# Patient Record
Sex: Female | Born: 2000 | Race: Black or African American | Hispanic: No | Marital: Single | Smoking: Current every day smoker
Health system: Southern US, Community
[De-identification: ages and names within clinical notes are randomized; demographics above are authoritative.]

## PROBLEM LIST (undated history)

## (undated) DIAGNOSIS — N730 Acute parametritis and pelvic cellulitis: Secondary | ICD-10-CM

## (undated) DIAGNOSIS — N83209 Unspecified ovarian cyst, unspecified side: Secondary | ICD-10-CM

## (undated) DIAGNOSIS — N939 Abnormal uterine and vaginal bleeding, unspecified: Secondary | ICD-10-CM

## (undated) DIAGNOSIS — F329 Major depressive disorder, single episode, unspecified: Secondary | ICD-10-CM

## (undated) DIAGNOSIS — Z8742 Personal history of other diseases of the female genital tract: Secondary | ICD-10-CM

## (undated) DIAGNOSIS — F419 Anxiety disorder, unspecified: Secondary | ICD-10-CM

## (undated) DIAGNOSIS — F32A Depression, unspecified: Secondary | ICD-10-CM

## (undated) DIAGNOSIS — Z8619 Personal history of other infectious and parasitic diseases: Secondary | ICD-10-CM

## (undated) DIAGNOSIS — D649 Anemia, unspecified: Secondary | ICD-10-CM

## (undated) DIAGNOSIS — G43009 Migraine without aura, not intractable, without status migrainosus: Secondary | ICD-10-CM

## (undated) DIAGNOSIS — A64 Unspecified sexually transmitted disease: Secondary | ICD-10-CM

## (undated) DIAGNOSIS — N946 Dysmenorrhea, unspecified: Secondary | ICD-10-CM

## (undated) HISTORY — DX: Depression, unspecified: F32.A

## (undated) HISTORY — DX: Migraine without aura, not intractable, without status migrainosus: G43.009

## (undated) HISTORY — DX: Unspecified sexually transmitted disease: A64

## (undated) HISTORY — DX: Anemia, unspecified: D64.9

## (undated) HISTORY — DX: Personal history of other infectious and parasitic diseases: Z86.19

## (undated) HISTORY — DX: Personal history of other diseases of the female genital tract: Z87.42

## (undated) HISTORY — DX: Anxiety disorder, unspecified: F41.9

## (undated) HISTORY — PX: NO PAST SURGERIES: SHX2092

## (undated) HISTORY — DX: Dysmenorrhea, unspecified: N94.6

## (undated) HISTORY — DX: Major depressive disorder, single episode, unspecified: F32.9

---
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
## 2018-07-27 DIAGNOSIS — W260XXA Contact with knife, initial encounter: Secondary | ICD-10-CM | POA: Insufficient documentation

## 2018-07-27 DIAGNOSIS — Y939 Activity, unspecified: Secondary | ICD-10-CM | POA: Diagnosis not present

## 2018-07-27 DIAGNOSIS — Y929 Unspecified place or not applicable: Secondary | ICD-10-CM | POA: Insufficient documentation

## 2018-07-27 DIAGNOSIS — S61219A Laceration without foreign body of unspecified finger without damage to nail, initial encounter: Secondary | ICD-10-CM | POA: Insufficient documentation

## 2018-07-27 DIAGNOSIS — Z5321 Procedure and treatment not carried out due to patient leaving prior to being seen by health care provider: Secondary | ICD-10-CM | POA: Insufficient documentation

## 2018-07-27 DIAGNOSIS — Y999 Unspecified external cause status: Secondary | ICD-10-CM | POA: Diagnosis not present

## 2018-07-28 ENCOUNTER — Ambulatory Visit (HOSPITAL_COMMUNITY)
Admission: EM | Admit: 2018-07-28 | Discharge: 2018-07-28 | Disposition: A | Discharge status: Home / Self Care | Payer: 59 | Attending: Family Medicine | Admitting: Family Medicine

## 2018-07-28 ENCOUNTER — Encounter (HOSPITAL_COMMUNITY): Payer: Self-pay

## 2018-07-28 ENCOUNTER — Emergency Department (HOSPITAL_COMMUNITY)
Admission: EM | Admit: 2018-07-28 | Discharge: 2018-07-28 | Disposition: A | Discharge status: Home / Self Care | Payer: 59 | Attending: Emergency Medicine | Admitting: Emergency Medicine

## 2018-07-28 ENCOUNTER — Other Ambulatory Visit: Payer: Self-pay

## 2018-07-28 DIAGNOSIS — S61215A Laceration without foreign body of left ring finger without damage to nail, initial encounter: Secondary | ICD-10-CM

## 2018-07-28 DIAGNOSIS — S61207A Unspecified open wound of left little finger without damage to nail, initial encounter: Secondary | ICD-10-CM | POA: Diagnosis not present

## 2018-07-28 DIAGNOSIS — W268XXA Contact with other sharp object(s), not elsewhere classified, initial encounter: Secondary | ICD-10-CM

## 2018-07-28 DIAGNOSIS — S61217A Laceration without foreign body of left little finger without damage to nail, initial encounter: Secondary | ICD-10-CM

## 2018-07-28 MED ORDER — IBUPROFEN 800 MG PO TABS: 800.0000 mg | ORAL_TABLET | Freq: Three times a day (TID) | ORAL | 0 refills | Status: DC

## 2018-07-28 NOTE — ED Provider Notes (Signed)
  [facility]   [id number] 07/28/18 Arrival Time: 1151  ASSESSMENT & PLAN:  1. Laceration of left ring finger without foreign body without damage to nail, initial encounter   2. Laceration of left little finger without foreign body without damage to nail, initial encounter    Finger wounds flushed under running water. Steri-strips applied without complication. May f/u as needed. Work note given.  Reviewed expectations re: course of current medical issues. Questions answered. Outlined signs and symptoms indicating need for more acute intervention. Patient verbalized understanding. After Visit Summary given.   SUBJECTIVE:  Nida BoatmanMahagony Muela is a 17 y.o. female who presents with lacerations to her L 4/5th fingers. Last evening. Seen in ED. Bandages applied. Here for evaluation. Minimal bleeding. Soreness present. No sensation changes.  Td UTD: Yes.  ROS: As per HPI.   OBJECTIVE:  Vitals:   07/28/18 1248 07/28/18 1251  BP: 108/69   Pulse: 77   Resp: 17   Temp: 99.2 F (37.3 C)   TempSrc: Oral   SpO2: 98% 98%     General appearance: alert; no distress Skin: lacerations of her L 4/5th distal fingers; linear; approx 1cm; minimal bleeding; fingers with FROM and normal capillary refill Psychological: alert and cooperative; normal mood and affect  Results for orders placed or performed in visit on 03/24/14  POCT urine pregnancy  Result Value Ref Range   Preg Test, Ur Negative     Labs Reviewed - No data to display  No results found.  Allergies  Allergen Reactions  . Bactrim [Sulfamethoxazole-Trimethoprim] Anaphylaxis    Chest is tight    History reviewed. No pertinent past medical history. Social History   Socioeconomic History  . Marital status: Single    Spouse name: Not on file  . Number of children: Not on file  . Years of education: Not on file  . Highest education level: Not on file  Occupational History  . Not on file  Social Needs  .  Financial resource strain: Not on file  . Food insecurity:    Worry: Not on file    Inability: Not on file  . Transportation needs:    Medical: Not on file    Non-medical: Not on file  Tobacco Use  . Smoking status: Never Smoker  . Smokeless tobacco: Never Used  Substance and Sexual Activity  . Alcohol use: No  . Drug use: No  . Sexual activity: Never  Lifestyle  . Physical activity:    Days per week: Not on file    Minutes per session: Not on file  . Stress: Not on file  Relationships  . Social connections:    Talks on phone: Not on file    Gets together: Not on file    Attends religious service: Not on file    Active member of club or organization: Not on file    Attends meetings of clubs or organizations: Not on file    Relationship status: Not on file  Other Topics Concern  . Not on file  Social History Narrative  . Not on file         Mardella LaymanHagler, Brendt Dible, MD 07/28/18 1352

## 2018-07-28 NOTE — ED Notes (Signed)
Around 0130 I was called out to the waiting area to speak with the patient. Pt asking for medical supplies to take her with her tonight because she is leaving. Pt has two fingers that have coban on them still intact. Mother reports they are just going to leave "because its too late to sew her up anyway".

## 2018-07-28 NOTE — ED Triage Notes (Addendum)
[facility] hand ring finger and pinky finger since last night. Pt states she cut finger while opening a box of spoons

## 2018-07-28 NOTE — ED Triage Notes (Signed)
Pt reports that she cut her fingers (middle, index, and thumb) while opening a box of spoons. Bleeding controlled. Able to move digits.

## 2018-09-13 ENCOUNTER — Encounter: Payer: Self-pay | Admitting: Obstetrics and Gynecology

## 2018-09-13 ENCOUNTER — Telehealth: Payer: Self-pay | Admitting: Obstetrics and Gynecology

## 2018-09-13 ENCOUNTER — Other Ambulatory Visit: Payer: Self-pay

## 2018-09-13 ENCOUNTER — Ambulatory Visit (INDEPENDENT_AMBULATORY_CARE_PROVIDER_SITE_OTHER): Payer: 59 | Admitting: Obstetrics and Gynecology

## 2018-09-13 ENCOUNTER — Telehealth: Payer: Self-pay

## 2018-09-13 VITALS — BP 88/52 | HR 74 | Resp 12 | Ht 63.0 in | Wt 95.4 lb

## 2018-09-13 DIAGNOSIS — N920 Excessive and frequent menstruation with regular cycle: Secondary | ICD-10-CM | POA: Diagnosis not present

## 2018-09-13 DIAGNOSIS — Z862 Personal history of diseases of the blood and blood-forming organs and certain disorders involving the immune mechanism: Secondary | ICD-10-CM

## 2018-09-13 DIAGNOSIS — Z01419 Encounter for gynecological examination (general) (routine) without abnormal findings: Secondary | ICD-10-CM

## 2018-09-13 DIAGNOSIS — Z113 Encounter for screening for infections with a predominantly sexual mode of transmission: Secondary | ICD-10-CM

## 2018-09-13 DIAGNOSIS — Z308 Encounter for other contraceptive management: Secondary | ICD-10-CM

## 2018-09-13 DIAGNOSIS — N946 Dysmenorrhea, unspecified: Secondary | ICD-10-CM

## 2018-09-13 DIAGNOSIS — N6322 Unspecified lump in the left breast, upper inner quadrant: Secondary | ICD-10-CM

## 2018-09-13 DIAGNOSIS — N6312 Unspecified lump in the right breast, upper inner quadrant: Secondary | ICD-10-CM

## 2018-09-13 DIAGNOSIS — N898 Other specified noninflammatory disorders of vagina: Secondary | ICD-10-CM

## 2018-09-13 DIAGNOSIS — Z0000 Encounter for general adult medical examination without abnormal findings: Secondary | ICD-10-CM

## 2018-09-13 MED ORDER — IBUPROFEN 800 MG PO TABS: 800.0000 mg | ORAL_TABLET | Freq: Three times a day (TID) | ORAL | 2 refills | Status: DC | PRN

## 2018-09-13 MED ORDER — NAPROXEN SODIUM 550 MG PO TABS: ORAL_TABLET | ORAL | 2 refills | Status: DC

## 2018-09-13 NOTE — Telephone Encounter (Signed)
Spoke with patient. Patient states naproxen Rx not covered by insurance, patient is unsure of her out of pocket cost, did not ask pharmacy.   Reviewed options:  1. Good Rx coupon: approximately $12.94 at [facility], [facility] 2. Transfer Rx to another pharmacy to reduce cost 3. OTC naproxen sodium   Advised to return call to office if any additional assistance is needed.   Routing to provider for final review. Patient is agreeable to disposition. Will close encounter.

## 2018-09-13 NOTE — Telephone Encounter (Signed)
Spoke with patient, advised as seen below per Dr. Oscar LaJertson. Patient request Rx for motrin 800 to [facility]. Rx d/c for anaprox. Patient verbalizes understanding.  Call placed to [facility]. Spoke with "Unice CobbleYkyta",RX cancelled for anaprox.

## 2018-09-13 NOTE — Telephone Encounter (Signed)
Patient stated that she went to go pick up prescription for Anaprox DS and it is not covered by insurance. °

## 2018-09-13 NOTE — Patient Instructions (Addendum)
EXERCISE AND DIET:  We recommended that you start or continue a regular exercise program for good health. Regular exercise means any activity that makes your heart beat faster and makes you sweat.  We recommend exercising at least 30 minutes per day at least 3 days a week, preferably 4 or 5.  We also recommend a diet low in fat and sugar.  Inactivity, poor dietary choices and obesity can cause diabetes, heart attack, stroke, and kidney damage, among others.    ALCOHOL AND SMOKING:  Women should limit their alcohol intake to no more than 7 drinks/beers/glasses of wine (combined, not each!) per week. Moderation of alcohol intake to this level decreases your risk of breast cancer and liver damage. And of course, no recreational drugs are part of a healthy lifestyle.  And absolutely no smoking or even second hand smoke. Most people know smoking can cause heart and lung diseases, but did you know it also contributes to weakening of your bones? Aging of your skin?  Yellowing of your teeth and nails?  CALCIUM AND VITAMIN D:  Adequate intake of calcium and Vitamin D are recommended.  The recommendations for exact amounts of these supplements seem to change often, but generally speaking 600 mg of calcium (either carbonate or citrate) and 800 units of Vitamin D per day seems prudent. Certain women may benefit from higher intake of Vitamin D.  If you are among these women, your doctor will have told you during your visit.    PAP SMEARS:  Pap smears, to check for cervical cancer or precancers,  have traditionally been done yearly, although recent scientific advances have shown that most women can have pap smears less often.  However, every woman still should have a physical exam from her gynecologist every year. It will include a breast check, inspection of the vulva and vagina to check for abnormal growths or skin changes, a visual exam of the cervix, and then an exam to evaluate the size and shape of the uterus and  ovaries.  And after 19 years of age, a rectal exam is indicated to check for rectal cancers. We will also provide age appropriate advice regarding health maintenance, like when you should have certain vaccines, screening for sexually transmitted diseases, bone density testing, colonoscopy, mammograms, etc.   MAMMOGRAMS:  All women over 40 years old should have a yearly mammogram. Many facilities now offer a "3D" mammogram, which may cost around $50 extra out of pocket. If possible,  we recommend you accept the option to have the 3D mammogram performed.  It both reduces the number of women who will be called back for extra views which then turn out to be normal, and it is better than the routine mammogram at detecting truly abnormal areas.    COLONOSCOPY:  Colonoscopy to screen for colon cancer is recommended for all women at age 50.  We know, you hate the idea of the prep.  We agree, BUT, having colon cancer and not knowing it is worse!!  Colon cancer so often starts as a polyp that can be seen and removed at colonscopy, which can quite literally save your life!  And if your first colonoscopy is normal and you have no family history of colon cancer, most women don't have to have it again for 10 years.  Once every ten years, you can do something that may end up saving your life, right?  We will be happy to help you get it scheduled when you are ready.    Be sure to check your insurance coverage so you understand how much it will cost.  It may be covered as a preventative service at no cost, but you should check your particular policy.     Medroxyprogesterone injection [Contraceptive] What is this medicine? MEDROXYPROGESTERONE (me DROX ee proe JES te rone) contraceptive injections prevent pregnancy. They provide effective birth control for 3 months. Depo-subQ Provera 104 is also used for treating pain related to endometriosis. This medicine may be used for other purposes; ask your health care provider or  pharmacist if you have questions. COMMON BRAND NAME(S): Depo-Provera, Depo-subQ Provera 104 What should I tell my health care provider before I take this medicine? They need to know if you have any of these conditions: -frequently drink alcohol -asthma -blood vessel disease or a history of a blood clot in the lungs or legs -bone disease such as osteoporosis -breast cancer -diabetes -eating disorder (anorexia nervosa or bulimia) -high blood pressure -HIV infection or AIDS -kidney disease -liver disease -mental depression -migraine -seizures (convulsions) -stroke -tobacco smoker -vaginal bleeding -an unusual or allergic reaction to medroxyprogesterone, other hormones, medicines, foods, dyes, or preservatives -pregnant or trying to get pregnant -breast-feeding How should I use this medicine? Depo-Provera Contraceptive injection is given into a muscle. Depo-subQ Provera 104 injection is given under the skin. These injections are given by a health care professional. You must not be pregnant before getting an injection. The injection is usually given during the first 5 days after the start of a menstrual period or 6 weeks after delivery of a baby. Talk to your pediatrician regarding the use of this medicine in children. Special care may be needed. These injections have been used in female children who have started having menstrual periods. Overdosage: If you think you have taken too much of this medicine contact a poison control center or emergency room at once. NOTE: This medicine is only for you. Do not share this medicine with others. What if I miss a dose? Try not to miss a dose. You must get an injection once every 3 months to maintain birth control. If you cannot keep an appointment, call and reschedule it. If you wait longer than 13 weeks between Depo-Provera contraceptive injections or longer than 14 weeks between Depo-subQ Provera 104 injections, you could get pregnant. Use another  method for birth control if you miss your appointment. You may also need a pregnancy test before receiving another injection. What may interact with this medicine? Do not take this medicine with any of the following medications: -bosentan This medicine may also interact with the following medications: -aminoglutethimide -antibiotics or medicines for infections, especially rifampin, rifabutin, rifapentine, and griseofulvin -aprepitant -barbiturate medicines such as phenobarbital or primidone -bexarotene -carbamazepine -medicines for seizures like ethotoin, felbamate, oxcarbazepine, phenytoin, topiramate -modafinil -St. John's wort This list may not describe all possible interactions. Give your health care provider a list of all the medicines, herbs, non-prescription drugs, or dietary supplements you use. Also tell them if you smoke, drink alcohol, or use illegal drugs. Some items may interact with your medicine. What should I watch for while using this medicine? This drug does not protect you against HIV infection (AIDS) or other sexually transmitted diseases. Use of this product may cause you to lose calcium from your bones. Loss of calcium may cause weak bones (osteoporosis). Only use this product for more than 2 years if other forms of birth control are not right for you. The longer you use this product for birth  control the more likely you will be at risk for weak bones. Ask your health care professional how you can keep strong bones. You may have a change in bleeding pattern or irregular periods. Many females stop having periods while taking this drug. If you have received your injections on time, your chance of being pregnant is very low. If you think you may be pregnant, see your health care professional as soon as possible. Tell your health care professional if you want to get pregnant within the next year. The effect of this medicine may last a long time after you get your last  injection. What side effects may I notice from receiving this medicine? Side effects that you should report to your doctor or health care professional as soon as possible: -allergic reactions like skin rash, itching or hives, swelling of the face, lips, or tongue -breast tenderness or discharge -breathing problems -changes in vision -depression -feeling faint or lightheaded, falls -fever -pain in the abdomen, chest, groin, or leg -problems with balance, talking, walking -unusually weak or tired -yellowing of the eyes or skin Side effects that usually do not require medical attention (report to your doctor or health care professional if they continue or are bothersome): -acne -fluid retention and swelling -headache -irregular periods, spotting, or absent periods -temporary pain, itching, or skin reaction at site where injected -weight gain This list may not describe all possible side effects. Call your doctor for medical advice about side effects. You may report side effects to FDA at 1-800-FDA-1088. Where should I keep my medicine? This does not apply. The injection will be given to you by a health care professional. NOTE: This sheet is a summary. It may not cover all possible information. If you have questions about this medicine, talk to your doctor, pharmacist, or health care provider.  2018 Elsevier/Gold Standard (2009-01-02 18:37:56)  Breast Self-Awareness Breast self-awareness means being familiar with how your breasts look and feel. It involves checking your breasts regularly and reporting any changes to your health care provider. Practicing breast self-awareness is important. A change in your breasts can be a sign of a serious medical problem. Being familiar with how your breasts look and feel allows you to find any problems early, when treatment is more likely to be successful. All women should practice breast self-awareness, including women who have had breast implants. How  to do a breast self-exam One way to learn what is normal for your breasts and whether your breasts are changing is to do a breast self-exam. To do a breast self-exam: Look for Changes  1. Remove all the clothing above your waist. 2. Stand in front of a mirror in a room with good lighting. 3. Put your hands on your hips. 4. Push your hands firmly downward. 5. Compare your breasts in the mirror. Look for differences between them (asymmetry), such as: ? Differences in shape. ? Differences in size. ? Puckers, dips, and bumps in one breast and not the other. 6. Look at each breast for changes in your skin, such as: ? Redness. ? Scaly areas. 7. Look for changes in your nipples, such as: ? Discharge. ? Bleeding. ? Dimpling. ? Redness. ? A change in position. Feel for Changes  Carefully feel your breasts for lumps and changes. It is best to do this while lying on your back on the floor and again while sitting or standing in the shower or tub with soapy water on your skin. Feel each breast in the following  way:  Place the arm on the side of the breast you are examining above your head.  Feel your breast with the other hand.  Start in the nipple area and make  inch (2 cm) overlapping circles to feel your breast. Use the pads of your three middle fingers to do this. Apply light pressure, then medium pressure, then firm pressure. The light pressure will allow you to feel the tissue closest to the skin. The medium pressure will allow you to feel the tissue that is a little deeper. The firm pressure will allow you to feel the tissue close to the ribs.  Continue the overlapping circles, moving downward over the breast until you feel your ribs below your breast.  Move one finger-width toward the center of the body. Continue to use the  inch (2 cm) overlapping circles to feel your breast as you move slowly up toward your collarbone.  Continue the up and down exam using all three pressures until  you reach your armpit.  Write Down What You Find  Write down what is normal for each breast and any changes that you find. Keep a written record with breast changes or normal findings for each breast. By writing this information down, you do not need to depend only on memory for size, tenderness, or location. Write down where you are in your menstrual cycle, if you are still menstruating. If you are having trouble noticing differences in your breasts, do not get discouraged. With time you will become more familiar with the variations in your breasts and more comfortable with the exam. How often should I examine my breasts? Examine your breasts every month. If you are breastfeeding, the best time to examine your breasts is after a feeding or after using a breast pump. If you menstruate, the best time to examine your breasts is 5-7 days after your period is over. During your period, your breasts are lumpier, and it may be more difficult to notice changes. When should I see my health care provider? See your health care provider if you notice:  A change in shape or size of your breasts or nipples.  A change in the skin of your breast or nipples, such as a reddened or scaly area.  Unusual discharge from your nipples.  A lump or thick area that was not there before.  Pain in your breasts.  Anything that concerns you.  This information is not intended to replace advice given to you by your health care provider. Make sure you discuss any questions you have with your health care provider. Document Released: 12/12/2005 Document Revised: 05/19/2016 Document Reviewed: 11/01/2015 Elsevier Interactive Patient Education  [employer].

## 2018-09-13 NOTE — Telephone Encounter (Signed)
Please offer to call in ibuprofen (800 mg po q 8 hours prn) as an alternative. If she desires this call in 30 tabs with 2 refills.

## 2018-09-13 NOTE — Addendum Note (Signed)
Addended by: Leda MinHAMM, Delainey Winstanley N on: 09/13/2018 11:49 AM   Modules accepted: Orders

## 2018-09-13 NOTE — Telephone Encounter (Signed)
Patient stated that she went to go pick up prescription for Anaprox DS and it is not covered by insurance.

## 2018-09-13 NOTE — Telephone Encounter (Signed)
Naproxen 550mg  is not covered by Medicaid. Naproxen 500 is preferred.

## 2018-09-13 NOTE — Progress Notes (Signed)
17 y.o. G0P0000 Single Black or African American Not Hispanic or Latino female here for annual exam.   Period Cycle (Days): 30 Period Duration (Days): 7-9 Period Pattern: Regular Menstrual Flow: Heavy, Moderate Menstrual Control: Tampon Menstrual Control Change Freq (Hours): states changes super plus every 2 hours Dysmenorrhea: (!) Severe Dysmenorrhea Symptoms: Cramping  Cramps are severe for 3 days, she can vomit from the pain. Ibuprofen may or may not help. Takes the edge off.  Sexually active, same partner x 3 years. Only sexually active with this partner since last year. +/- using condoms. No dyspareunia.  She notes a thick white d/c the week prior to her cycle.  She had a normal TSH a few months ago, was anemic at that time and started iron pills.   Patient's last menstrual period was 08/25/2018.          Sexually active: Yes.    The current method of family planning is condoms sometimes.    Exercising: No.  The patient does not participate in regular exercise at present. Smoker:  Vaping some days, cutting back.   Health Maintenance: Pap:  never History of abnormal Pap:  n/a TDaP:  UTD per patient Gardasil: completed x3 per patient   reports that she has never smoked. She has never used smokeless tobacco. She reports that she does not drink alcohol or use drugs. Occasional ETOH. 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   Past Medical History:  Diagnosis Date  . Anemia   . Anxiety   . Depression   . Dysmenorrhea     History reviewed. No pertinent surgical history.  Current Outpatient Medications  Medication Sig Dispense Refill  . ibuprofen (ADVIL,MOTRIN) 800 MG tablet Take 1 tablet (800 mg total) by mouth 3 (three) times daily. 21 tablet 0  . IRON PO Take by mouth.     No current facility-administered medications for this visit.     Family History  Problem Relation Age of Onset  . Endometriosis Mother   . Sarcoidosis Mother   . Heart disease Father      Review of Systems  Constitutional: Negative.   HENT: Negative.   Eyes: Negative.   Respiratory: Negative.   Cardiovascular: Negative.   Gastrointestinal: Negative.   Endocrine: Negative.   Genitourinary: Positive for menstrual problem and vaginal discharge.       Dysmenorrhea White discharge prior to cycle  Musculoskeletal: Negative.   Skin: Negative.   Allergic/Immunologic: Negative.   Neurological: Negative.   Hematological: Negative.   Psychiatric/Behavioral: Negative.     Exam:   BP (!) 88/52 (BP Location: Right Arm, Patient Position: Sitting, Cuff Size: Normal)   Pulse 74   Resp 12   Ht 5\' 3"  (1.6 m)   Wt 95 lb 6.4 oz (43.3 kg)   LMP 08/25/2018   BMI 16.90 kg/m   Weight change: @WEIGHTCHANGE @ Height:   Height: 5\' 3"  (160 cm)  Ht Readings from Last 3 Encounters:  09/13/18 5\' 3"  (1.6 m) (31 %, Z= -0.50)*  07/28/18 5\' 4"  (1.626 m) (46 %, Z= -0.11)*  01/20/16 5\' 1"  (1.549 m) (11 %, Z= -1.22)*   * Growth percentiles are based on CDC (Girls, 2-20 Years) data.    General appearance: alert, cooperative and appears stated age Head: Normocephalic, without obvious abnormality, atraumatic Neck: no adenopathy, supple, symmetrical, trachea midline and thyroid normal to inspection and palpation Lungs: clear to auscultation bilaterally Cardiovascular: regular rate and rhythm Breasts: in the right breast at 1 o'clock near the periphery  is a pea sized, firm, mobile, not tender lump. In the left breast at 10 o'clock a few cm from the areolar region is a firm, mobile, lima bean shaped lump. No skin changes.  Abdomen: soft, non-tender; non distended,  no masses,  no organomegaly Extremities: extremities normal, atraumatic, no cyanosis or edema Skin: Skin color, texture, turgor normal. No rashes or lesions Lymph nodes: Cervical, supraclavicular, and axillary nodes normal. No abnormal inguinal nodes palpated Neurologic: Grossly normal   Pelvic: External genitalia:  no lesions               Urethra:  normal appearing urethra with no masses, tenderness or lesions              Bartholins and Skenes: normal                 Vagina: normal appearing vagina with normal color. Slight increase in watery, white vaginal discharge              Cervix: no cervical motion tenderness and no lesions               Bimanual Exam:  Uterus:  normal size, contour, position, consistency, mobility, non-tender and retroverted              Adnexa: no mass, fullness, tenderness               Rectovaginal: deferred  Chaperone was present for exam.  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 Reviewed risks and side effects. Return for depo-provera on her cycle  Recommended condoms for STD protection  Anaprox for cramps  Bilateral breast ultrasounds

## 2018-09-13 NOTE — Progress Notes (Signed)
Patient scheduled for bilateral Breast ultrasounds at [facility] imaging on 09/28/2018 at 1010. Mother of patient with her and has questions about plan of care. Option to wait for further consult today with Dr. Oscar LaJertson or call back with first day of cycle for office visit with Dr. Oscar LaJertson to talk further about her concerns. Pt states she will call back on first day of cycle and schedule office visit to discuss plan of care.

## 2018-09-14 ENCOUNTER — Encounter: Payer: Self-pay | Admitting: Obstetrics and Gynecology

## 2018-09-14 ENCOUNTER — Telehealth: Payer: Self-pay | Admitting: Emergency Medicine

## 2018-09-14 LAB — COMPREHENSIVE METABOLIC PANEL
ALT: 8 IU/L (ref 0–32)
AST: 17 IU/L (ref 0–40)
Albumin/Globulin Ratio: 2 (ref 1.2–2.2)
Albumin: 4.9 g/dL (ref 3.5–5.5)
Alkaline Phosphatase: 66 IU/L (ref 39–117)
BUN/Creatinine Ratio: 22 (ref 9–23)
BUN: 18 mg/dL (ref 6–20)
Bilirubin Total: <0.2 mg/dL (ref 0.0–1.2)
CO2: 21 mmol/L (ref 20–29)
CREATININE: 0.82 mg/dL (ref 0.57–1.00)
Calcium: 9.9 mg/dL (ref 8.7–10.2)
Chloride: 103 mmol/L (ref 96–106)
GFR calc Af Amer: 120 mL/min/{1.73_m2} (ref >59)
GFR, EST NON AFRICAN AMERICAN: 104 mL/min/{1.73_m2} (ref >59)
GLUCOSE: 55 mg/dL — AB (ref 65–99)
Globulin, Total: 2.4 g/dL (ref 1.5–4.5)
POTASSIUM: 3.9 mmol/L (ref 3.5–5.2)
Sodium: 139 mmol/L (ref 134–144)
TOTAL PROTEIN: 7.3 g/dL (ref 6.0–8.5)

## 2018-09-14 LAB — CBC
HEMATOCRIT: 36.5 % (ref 34.0–46.6)
HEMOGLOBIN: 11.6 g/dL (ref 11.1–15.9)
MCH: 28 pg (ref 26.6–33.0)
MCHC: 31.8 g/dL (ref 31.5–35.7)
MCV: 88 fL (ref 79–97)
Platelets: 296 10*3/uL (ref 150–450)
RBC: 4.14 x10E6/uL (ref 3.77–5.28)
RDW: 13.7 % (ref 12.3–15.4)
WBC: 5.9 10*3/uL (ref 3.4–10.8)

## 2018-09-14 LAB — FERRITIN: Ferritin: 17 ng/mL (ref 15–77)

## 2018-09-14 LAB — VAGINITIS/VAGINOSIS, DNA PROBE
CANDIDA SPECIES: NEGATIVE
Gardnerella vaginalis: POSITIVE — AB
Trichomonas vaginosis: NEGATIVE

## 2018-09-14 LAB — LIPID PANEL
CHOL/HDL RATIO: 2.1 ratio (ref 0.0–4.4)
Cholesterol, Total: 155 mg/dL (ref 100–169)
HDL: 75 mg/dL (ref >39)
LDL CALC: 69 mg/dL (ref 0–109)
TRIGLYCERIDES: 53 mg/dL (ref 0–89)
VLDL Cholesterol Cal: 11 mg/dL (ref 5–40)

## 2018-09-14 LAB — HEP, RPR, HIV PANEL
HEP B S AG: NEGATIVE
HIV Screen 4th Generation wRfx: NONREACTIVE
RPR Ser Ql: NONREACTIVE

## 2018-09-14 LAB — HEPATITIS C ANTIBODY

## 2018-09-14 MED ORDER — NAPROXEN 500 MG PO TABS: 500.0000 mg | ORAL_TABLET | Freq: Two times a day (BID) | ORAL | 2 refills | Status: DC | PRN

## 2018-09-14 MED ORDER — METRONIDAZOLE 0.75 % VA GEL: 1.0000 | Freq: Every day | VAGINAL | 0 refills | Status: AC

## 2018-09-14 NOTE — Telephone Encounter (Signed)
Motrin 800 mg po sent in 09/13/18 by Triage Nurse Carmelina DaneJill Hamm. Pt picked up Rx yesterday.

## 2018-09-14 NOTE — Telephone Encounter (Signed)
Noted message from yesterday. Pt has picked up Motrin 800 mg po. Order for Naproxen 500 mg tablet DC and [facility] notified.

## 2018-09-14 NOTE — Telephone Encounter (Signed)
Spoke with patinet and message from Dr Oscar LaJertson given. Metrogel Rx sent and discussed use.  Verbal order received for Naproxen 500 mg po q 12 PRN Cramps from Dr. Edward JollySilva.  Will call back with further CG/CH results.  Encounter closed.

## 2018-09-14 NOTE — Telephone Encounter (Signed)
-----   Message from Romualdo BolkJill Evelyn Jertson, MD sent at 09/14/2018 10:25 AM EDT ----- Please inform the patient that her vaginitis probe was + for BV and treat with flagyl (either oral or vaginal, her choice), no ETOH while on Flagyl.  Oral: Flagyl 500 mg BID x 7 days, or Vaginal: Metrogel, 1 applicator per vagina q day x 5 days. Her hep C and cervical cultures are pending, everything else was normal.

## 2018-09-16 LAB — GC/CHLAMYDIA PROBE AMP
Chlamydia trachomatis, NAA: NEGATIVE
Neisseria gonorrhoeae by PCR: NEGATIVE

## 2018-09-17 ENCOUNTER — Telehealth: Payer: Self-pay

## 2018-09-17 NOTE — Telephone Encounter (Signed)
Informed patient of results

## 2018-09-17 NOTE — Telephone Encounter (Signed)
-----   Message from Romualdo BolkJill Evelyn Jertson, MD sent at 09/16/2018  1:55 PM EDT ----- Cervical cultures and Hep C are negative

## 2018-09-20 ENCOUNTER — Other Ambulatory Visit: Payer: 59

## 2018-09-28 ENCOUNTER — Ambulatory Visit
Admission: RE | Admit: 2018-09-28 | Discharge: 2018-09-28 | Disposition: A | Discharge status: Home / Self Care | Payer: 59 | Source: Ambulatory Visit | Attending: Obstetrics and Gynecology | Admitting: Obstetrics and Gynecology

## 2018-09-28 ENCOUNTER — Other Ambulatory Visit: Payer: Self-pay | Admitting: Obstetrics and Gynecology

## 2018-09-28 DIAGNOSIS — N6310 Unspecified lump in the right breast, unspecified quadrant: Secondary | ICD-10-CM

## 2018-09-28 DIAGNOSIS — N6320 Unspecified lump in the left breast, unspecified quadrant: Secondary | ICD-10-CM

## 2018-09-28 DIAGNOSIS — N6312 Unspecified lump in the right breast, upper inner quadrant: Secondary | ICD-10-CM

## 2018-09-28 DIAGNOSIS — N6322 Unspecified lump in the left breast, upper inner quadrant: Secondary | ICD-10-CM

## 2018-10-01 ENCOUNTER — Telehealth: Payer: Self-pay | Admitting: Obstetrics and Gynecology

## 2018-10-01 NOTE — Telephone Encounter (Signed)
Spoke with patient. Reports vaginal itching, burning and white, thick, clumpy vaginal d/c. Symptoms started 3 days ago. Denies urinary symptoms, odor or pain. Completed metrogel 9/24, LMP 9/25. Asking for recommendations.  Advised patient can try OTC monistat, if symptoms don not resolve OV recommended for further evaluation. Advised Dr. Oscar La will review, I will return call if any additional recommendations.  Patient verbalizes understanding and is agreeable.   Routing to provider for final review. Patient is agreeable to disposition. Will close encounter.

## 2018-10-01 NOTE — Telephone Encounter (Signed)
Patient is asking to talk with a nurse about her medication. No further details given.

## 2018-10-02 ENCOUNTER — Telehealth: Payer: Self-pay

## 2018-10-02 NOTE — Telephone Encounter (Signed)
Patient returning call to Kaitlyn. °

## 2018-10-02 NOTE — Telephone Encounter (Signed)
Left message to call Kaitlyn at [phone].  Patient removed from mammogram hold. 6 month recall placed.

## 2018-10-02 NOTE — Telephone Encounter (Signed)
-----   Message from Romualdo Bolk, MD sent at 09/28/2018  1:39 PM EDT ----- Take out of mammogram hold, place in recall for 6 months. Please also schedule her for a f/u breast check in 3 months. She should do monthly breast self exams and call with any changes or concerns

## 2018-10-02 NOTE — Telephone Encounter (Signed)
Spoke with patient. Patient states that she will need to speak with her mom regarding appointment scheduling and will return call to schedule appointment.

## 2018-10-09 NOTE — Telephone Encounter (Signed)
Left message to call Dael Howland at [phone]. 

## 2018-10-14 ENCOUNTER — Encounter

## 2018-10-23 NOTE — Telephone Encounter (Signed)
Spoke with patient. Appointment scheduled for 11/12/2018 at 2 pm. Patient is agreeable to date and time. Encounter closed.

## 2018-11-10 ENCOUNTER — Emergency Department (HOSPITAL_COMMUNITY)
Admission: EM | Admit: 2018-11-10 | Discharge: 2018-11-10 | Disposition: A | Discharge status: Home / Self Care | Payer: 59 | Attending: Emergency Medicine | Admitting: Emergency Medicine

## 2018-11-10 ENCOUNTER — Other Ambulatory Visit: Payer: Self-pay

## 2018-11-10 ENCOUNTER — Encounter (HOSPITAL_COMMUNITY): Payer: Self-pay

## 2018-11-10 DIAGNOSIS — R21 Rash and other nonspecific skin eruption: Secondary | ICD-10-CM | POA: Diagnosis present

## 2018-11-10 DIAGNOSIS — B354 Tinea corporis: Secondary | ICD-10-CM

## 2018-11-10 NOTE — Discharge Instructions (Addendum)
Apply Lotrimin cream to area as directed.  Continue to apply cream to the area after 2 weeks of resolution. Follow-up with dermatology if symptoms persist.  Return to ER for severe or concerning symptoms.

## 2018-11-10 NOTE — ED Triage Notes (Signed)
Pt reports rash that started 2 weeks ago on her abd and a spot on her chest, denies itchiness. Dry patch

## 2018-11-10 NOTE — ED Provider Notes (Signed)
[facility] Provider Note   CSN: [id number] Arrival date & time: 11/10/18  2028     History   Chief Complaint Chief Complaint  Patient presents with  . Rash    HPI Mercedes Guzman is a 17 y.o. female.  17 year old female presents with complaint of rash on her trunk onset 2 weeks ago.  Patient believes she may have ringworm, initial patch on her left lower chest now with a patch to her right upper chest and right lower chest areas.  Denies itching or any other complaints or concerns.     Past Medical History:  Diagnosis Date  . Anemia   . Anxiety   . Depression   . Dysmenorrhea   . Migraine without aura     Patient Active Problem List   Diagnosis Date Noted  . Dysmenorrhea 03/27/2014    History reviewed. No pertinent surgical history.   OB History    Gravida  0   Para  0   Term  0   Preterm  0   AB  0   Living  0     SAB  0   TAB  0   Ectopic  0   Multiple  0   Live Births               Home Medications    Prior to Admission medications   Medication Sig Start Date End Date Taking? Authorizing Provider  IRON PO Take by mouth.    [provider]    Family History Family History  Problem Relation Age of Onset  . Endometriosis Mother   . Sarcoidosis Mother   . Heart disease Father     Social History Social History   Tobacco Use  . Smoking status: Never Smoker  . Smokeless tobacco: Never Used  Substance Use Topics  . Alcohol use: No  . Drug use: No     Allergies   Bactrim [sulfamethoxazole-trimethoprim]   Review of Systems Review of Systems  Constitutional: Negative for fever.  Musculoskeletal: Negative for joint swelling and myalgias.  Skin: Positive for rash. Negative for wound.  Allergic/Immunologic: Negative for immunocompromised state.  Hematological: Negative for adenopathy. Does not bruise/bleed easily.  All other systems reviewed and are  negative.    Physical Exam Updated Vital Signs BP 114/80 (BP Location: Right Arm)   Pulse 73   Temp 98.8 F (37.1 C) (Oral)   Resp 14   SpO2 100%   Physical Exam  Constitutional: She is oriented to person, place, and time. She appears well-developed and well-nourished. No distress.  HENT:  Head: Normocephalic and atraumatic.  Pulmonary/Chest: Effort normal.  Neurological: She is alert and oriented to person, place, and time.  Skin: Skin is warm and dry. Rash noted. She is not diaphoretic.     Psychiatric: She has a normal mood and affect. Her behavior is normal.  Nursing note and vitals reviewed.    ED Treatments / Results  Labs (all labs ordered are listed, but only abnormal results are displayed) Labs Reviewed - No data to display  EKG None  Radiology No results found.  Procedures Procedures (including critical care time)  Medications Ordered in ED Medications - No data to display   Initial Impression / Assessment and Plan / ED Course  I have reviewed the triage vital signs and the nursing notes.  Pertinent labs & imaging results that were available during my care of the patient were reviewed by me  and considered in my medical decision making (see chart for details).  Clinical Course as of Nov 10 2122  Sat Nov 10, 2018  [id number] 17 year old female with rash consistent with ringworm.  Recommend OTC Lotrimin cream to area and continue with cream until rash resolves x2 weeks.   [LM]    Clinical Course User Index [LM] Jeannie Fend, PA-C   Final Clinical Impressions(s) / ED Diagnoses   Final diagnoses:  Tinea corporis    ED Discharge Orders    None       Alden Hipp 11/10/18 2123    Maia Plan, MD 11/11/18 [phone]

## 2018-11-12 ENCOUNTER — Encounter (HOSPITAL_COMMUNITY): Payer: Self-pay

## 2018-11-12 ENCOUNTER — Ambulatory Visit (HOSPITAL_COMMUNITY)
Admission: EM | Admit: 2018-11-12 | Discharge: 2018-11-12 | Disposition: A | Discharge status: Home / Self Care | Payer: 59 | Attending: Family Medicine | Admitting: Family Medicine

## 2018-11-12 ENCOUNTER — Ambulatory Visit: Payer: 59 | Admitting: Obstetrics and Gynecology

## 2018-11-12 DIAGNOSIS — B354 Tinea corporis: Secondary | ICD-10-CM

## 2018-11-12 MED ORDER — TERBINAFINE HCL 250 MG PO TABS: 250.0000 mg | ORAL_TABLET | Freq: Every day | ORAL | 0 refills | Status: DC

## 2018-11-12 NOTE — ED Provider Notes (Signed)
[facility]    CSN: [id number] Arrival date & time: 11/12/18  1858     History   Chief Complaint Chief Complaint  Patient presents with  . Rash    HPI Mercedes Guzman is a 17 y.o. female.  Patient was seen in the emergency room 2 days ago and discharged with diagnosis of tinea corporis.  Lamisil cream was suggested.  Mom accompanies patient today concerned that she is still seeing new lesions.   HPI  Past Medical History:  Diagnosis Date  . Anemia   . Anxiety   . Depression   . Dysmenorrhea   . Migraine without aura     Patient Active Problem List   Diagnosis Date Noted  . Dysmenorrhea 03/27/2014    History reviewed. No pertinent surgical history.  OB History    Gravida  0   Para  0   Term  0   Preterm  0   AB  0   Living  0     SAB  0   TAB  0   Ectopic  0   Multiple  0   Live Births               Home Medications    Prior to Admission medications   Medication Sig Start Date End Date Taking? Authorizing Provider  IRON PO Take by mouth.    [provider]    Family History Family History  Problem Relation Age of Onset  . Endometriosis Mother   . Sarcoidosis Mother   . Heart disease Father     Social History Social History   Tobacco Use  . Smoking status: Never Smoker  . Smokeless tobacco: Never Used  Substance Use Topics  . Alcohol use: No  . Drug use: No     Allergies   Bactrim [sulfamethoxazole-trimethoprim]   Review of Systems Review of Systems  Constitutional: Negative for chills and fever.  HENT: Negative for ear pain and sore throat.   Eyes: Negative for pain and visual disturbance.  Respiratory: Negative for cough and shortness of breath.   Cardiovascular: Negative for chest pain and palpitations.  Gastrointestinal: Negative for abdominal pain and vomiting.  Genitourinary: Negative for dysuria and hematuria.  Musculoskeletal: Negative for arthralgias and back pain.  Skin: Positive  for rash. Negative for color change.  Neurological: Negative for seizures and syncope.  All other systems reviewed and are negative.    Physical Exam Triage Vital Signs ED Triage Vitals  Enc Vitals Group     BP 11/12/18 1947 113/66     Pulse Rate 11/12/18 1947 88     Resp 11/12/18 1947 20     Temp 11/12/18 1947 98.2 F (36.8 C)     Temp Source 11/12/18 1947 Oral     SpO2 11/12/18 1947 100 %     Weight --      Height --      Head Circumference --      Peak Flow --      Pain Score 11/12/18 1951 0     Pain Loc --      Pain Edu? --      Excl. in GC? --    No data found.  Updated Vital Signs BP 113/66 (BP Location: Left Arm)   Pulse 88   Temp 98.2 F (36.8 C) (Oral)   Resp 20   LMP 10/16/2018   SpO2 100%   Visual Acuity Right Eye Distance:   Left Eye  Distance:   Bilateral Distance:    Right Eye Near:   Left Eye Near:    Bilateral Near:     Physical Exam  Constitutional: She appears well-developed and well-nourished.  Skin:  There are several round circular lesions with raised edges but without central clearing it consistent with tinea corporis.     UC Treatments / Results  Labs (all labs ordered are listed, but only abnormal results are displayed) Labs Reviewed - No data to display  EKG None  Radiology No results found.  Procedures Procedures (including critical care time)  Medications Ordered in UC Medications - No data to display  Initial Impression / Assessment and Plan / UC Course  I have reviewed the triage vital signs and the nursing notes.  Pertinent labs & imaging results that were available during my care of the patient were reviewed by me and considered in my medical decision making (see chart for details).     Tinea corporis.  Will discontinue Lotrimin and use Lamisil in its place as well as Lamisil tablets to take once a day for a week Final Clinical Impressions(s) / UC Diagnoses   Final diagnoses:  None   Discharge  Instructions   None    ED Prescriptions    None     Controlled Substance Prescriptions [place] Controlled Substance Registry consulted? No   Frederica Kuster, MD 11/12/18 2009

## 2018-11-12 NOTE — ED Triage Notes (Signed)
Pt presents with rash from unknown source. 

## 2018-11-12 NOTE — Discharge Instructions (Addendum)
Recommend Lamisil cream to be applied once a day to lesions

## 2018-11-14 NOTE — Progress Notes (Signed)
GYNECOLOGY  VISIT   HPI: 17 y.o.   Single Black or African American Not Hispanic or Latino  female   G0P0000 with Patient's last menstrual period was 11/14/2018 (exact date).   here for breast recheck.  At her annual exam in 9/19 she was noted to have a right breast lump at 1 o'clock at the periphery and a left breast lump at 10 o'clock a few cm from the areolar region. Imaging showed 3 masses, all suspected fibroadenomas (see report). She has a f/u ultrasound scheduled for 4/20. She was treated for BV in 9/19, since then she has had intermittent episodes of an increased clumpy white vaginal d/c, itching off and on.   GYNECOLOGIC HISTORY: Patient's last menstrual period was 11/14/2018 (exact date). Contraception: Condoms Menopausal hormone therapy: None        OB History    Gravida  0   Para  0   Term  0   Preterm  0   AB  0   Living  0     SAB  0   TAB  0   Ectopic  0   Multiple  0   Live Births                 Patient Active Problem List   Diagnosis Date Noted  . Dysmenorrhea 03/27/2014    Past Medical History:  Diagnosis Date  . Anemia   . Anxiety   . Depression   . Dysmenorrhea   . Migraine without aura     History reviewed. No pertinent surgical history.  Current Outpatient Medications  Medication Sig Dispense Refill  . IRON PO Take by mouth.    . terbinafine (LAMISIL) 250 MG tablet Take 1 tablet (250 mg total) by mouth daily. 10 tablet 0   No current facility-administered medications for this visit.      ALLERGIES: Bactrim [sulfamethoxazole-trimethoprim]  Family History  Problem Relation Age of Onset  . Endometriosis Mother   . Sarcoidosis Mother   . Heart disease Father     Social History   Socioeconomic History  . Marital status: Single    Spouse name: Not on file  . Number of children: Not on file  . Years of education: Not on file  . Highest education level: Not on file  Occupational History  . Not on file  Social Needs   . Financial resource strain: Not on file  . Food insecurity:    Worry: Not on file    Inability: Not on file  . Transportation needs:    Medical: Not on file    Non-medical: Not on file  Tobacco Use  . Smoking status: Never Smoker  . Smokeless tobacco: Never Used  Substance and Sexual Activity  . Alcohol use: No  . Drug use: No  . Sexual activity: Yes    Birth control/protection: Condom  Lifestyle  . Physical activity:    Days per week: Not on file    Minutes per session: Not on file  . Stress: Not on file  Relationships  . Social connections:    Talks on phone: Not on file    Gets together: Not on file    Attends religious service: Not on file    Active member of club or organization: Not on file    Attends meetings of clubs or organizations: Not on file    Relationship status: Not on file  . Intimate partner violence:    Fear of current or ex  partner: Not on file    Emotionally abused: Not on file    Physically abused: Not on file    Forced sexual activity: Not on file  Other Topics Concern  . Not on file  Social History Narrative  . Not on file    Review of Systems  Constitutional: Negative.   HENT: Negative.   Eyes: Negative.   Respiratory: Negative.   Cardiovascular: Negative.   Gastrointestinal: Positive for nausea.  Genitourinary:       Painful menses Vaginal discharge  Musculoskeletal: Negative.        Breast pain  Skin: Positive for rash.  Neurological: Negative.   Endo/Heme/Allergies: Negative.        Craving sweets  Psychiatric/Behavioral: Negative.     PHYSICAL EXAMINATION:    BP (!) 88/54 (BP Location: Right Arm, Patient Position: Sitting, Cuff Size: Normal)   Pulse (!) 56   Wt 96 lb 3.2 oz (43.6 kg)   LMP 11/14/2018 (Exact Date)   BMI 17.04 kg/m     General appearance: alert, cooperative and appears stated age Breasts: in the right breast at 1 o'lock near the periphery is a smooth, mobile, non tender pea sized lump (stable). In the  left breast at 10 o'clock is a stable lima bean sized non-tender lump several cm from the areoloar region. Just peripheral to the lump in the left breast is a second, smaller, mobile, not tender lump. In the axillary tail on the left is a tender lump vs fibrocystic changes.  Abdomen: soft, non-tender; non distended, no masses,  no organomegaly  Pelvic: External genitalia:  no lesions              Urethra:  normal appearing urethra with no masses, tenderness or lesions              Bartholins and Skenes: normal                 Vagina: normal appearing vagina with normal color and discharge, no lesions              Cervix: no lesions               Chaperone was present for exam.  Wet prep: ? clue, no trich, few wbc KOH: no yeast PH: 5, also + blood   ASSESSMENT 3 breast lumps, stable on exam, likely fibroadenoma on exam New tender lump in the left axillary tail vs fibrocystic changes (just started her cycle) Vulvovaginitis, suspect BV on slides    PLAN Return in 1.5 weeks for repeat breast exam to evaluate the left axillary tail. The left breast is a little larger than the right and a little more fibrocystic in the axillary area.  Send affirm, treat accordingly Use Vaseline externally as needed    An After Visit Summary was printed and given to the patient.

## 2018-11-15 ENCOUNTER — Ambulatory Visit (INDEPENDENT_AMBULATORY_CARE_PROVIDER_SITE_OTHER): Payer: 59 | Admitting: Obstetrics and Gynecology

## 2018-11-15 ENCOUNTER — Other Ambulatory Visit: Payer: Self-pay

## 2018-11-15 ENCOUNTER — Encounter: Payer: Self-pay | Admitting: Obstetrics and Gynecology

## 2018-11-15 VITALS — BP 88/54 | HR 56 | Wt 96.2 lb

## 2018-11-15 DIAGNOSIS — N760 Acute vaginitis: Secondary | ICD-10-CM | POA: Diagnosis not present

## 2018-11-15 DIAGNOSIS — N63 Unspecified lump in unspecified breast: Secondary | ICD-10-CM

## 2018-11-15 NOTE — Addendum Note (Signed)
Addended by: Nolen MuSPRAGUE, KAITLYN E on: 11/15/2018 11:23 AM   Modules accepted: Orders

## 2018-11-16 LAB — VAGINITIS/VAGINOSIS, DNA PROBE
CANDIDA SPECIES: NEGATIVE
Gardnerella vaginalis: POSITIVE — AB
Trichomonas vaginosis: NEGATIVE

## 2018-11-19 ENCOUNTER — Telehealth: Payer: Self-pay

## 2018-11-19 NOTE — Telephone Encounter (Signed)
Left message to call Mercedes Guzman at [phone]. 

## 2018-11-19 NOTE — Telephone Encounter (Signed)
-----   Message from Romualdo BolkJill Evelyn Jertson, MD sent at 11/18/2018 11:40 AM EST ----- Please inform the patient that her vaginitis probe was + for BV and treat with flagyl (either oral or vaginal, her choice), no ETOH while on Flagyl.  Oral: Flagyl 500 mg BID x 7 days, or Vaginal: Metrogel, 1 applicator per vagina q day x 5 days.

## 2018-11-20 ENCOUNTER — Other Ambulatory Visit: Payer: Self-pay

## 2018-11-20 ENCOUNTER — Emergency Department (HOSPITAL_COMMUNITY)
Admission: EM | Admit: 2018-11-20 | Discharge: 2018-11-20 | Disposition: A | Discharge status: Home / Self Care | Payer: 59 | Attending: Emergency Medicine | Admitting: Emergency Medicine

## 2018-11-20 DIAGNOSIS — L988 Other specified disorders of the skin and subcutaneous tissue: Secondary | ICD-10-CM | POA: Diagnosis present

## 2018-11-20 DIAGNOSIS — L989 Disorder of the skin and subcutaneous tissue, unspecified: Secondary | ICD-10-CM

## 2018-11-20 NOTE — ED Provider Notes (Signed)
[facility]   CSN: [id number] Arrival date & time: 11/20/18  2205     History   Chief Complaint Chief Complaint  Patient presents with  . Hand Problem    HPI Mercedes Guzman is a 17 y.o. female.  The history is provided by the patient and medical records.     17 y.o. F with hx of anemia, depression, anxiety, presenting to the ED for hand concern.  Patient states earlier today she was cleaning with bleach and lysol products and noticed that afterwards she had a white film on her fingers.  She denies any pain, itching, burning of the skin.  By time of my evaluation, she states "it is better and I think I'm ok".    Past Medical History:  Diagnosis Date  . Anemia   . Anxiety   . Depression   . Dysmenorrhea   . Migraine without aura     Patient Active Problem List   Diagnosis Date Noted  . Dysmenorrhea 03/27/2014    No past surgical history on file.   OB History    Gravida  0   Para  0   Term  0   Preterm  0   AB  0   Living  0     SAB  0   TAB  0   Ectopic  0   Multiple  0   Live Births               Home Medications    Prior to Admission medications   Medication Sig Start Date End Date Taking? Authorizing Provider  IRON PO Take by mouth.    [provider]  terbinafine (LAMISIL) 250 MG tablet Take 1 tablet (250 mg total) by mouth daily. 11/12/18   Frederica KusterMiller, Stephen M, MD    Family History Family History  Problem Relation Age of Onset  . Endometriosis Mother   . Sarcoidosis Mother   . Heart disease Father     Social History Social History   Tobacco Use  . Smoking status: Never Smoker  . Smokeless tobacco: Never Used  Substance Use Topics  . Alcohol use: No  . Drug use: No     Allergies   Bactrim [sulfamethoxazole-trimethoprim]   Review of Systems Review of Systems  Skin: Positive for color change.  All other systems reviewed and are  negative.    Physical Exam Updated Vital Signs BP 120/78   Pulse (!) 118   Temp 99.1 F (37.3 C) (Oral)   Resp 18   Ht 5\' 3"  (1.6 m)   Wt 44 kg   LMP 11/14/2018 (Exact Date)   SpO2 100%   BMI 17.18 kg/m   Physical Exam  Constitutional: She is oriented to person, place, and time. She appears well-developed and well-nourished.  HENT:  Head: Normocephalic and atraumatic.  Mouth/Throat: Oropharynx is clear and moist.  Eyes: Pupils are equal, round, and reactive to light. Conjunctivae and EOM are normal.  Neck: Normal range of motion.  Cardiovascular: Normal rate, regular rhythm and normal heart sounds.  Pulmonary/Chest: Effort normal and breath sounds normal.  Abdominal: Soft. Bowel sounds are normal.  Musculoskeletal: Normal range of motion.  Palms with small areas of dry skin noted, particular in the joint creases and at the base of the fingers; remainder of hands are normal without any evidence of chemical burns or other skin changes  Neurological: She is alert and oriented to person, place, and  time.  Skin: Skin is warm and dry.  Psychiatric: She has a normal mood and affect.  Nursing note and vitals reviewed.    ED Treatments / Results  Labs (all labs ordered are listed, but only abnormal results are displayed) Labs Reviewed - No data to display  EKG None  Radiology No results found.  Procedures Procedures (including critical care time)  Medications Ordered in ED Medications - No data to display   Initial Impression / Assessment and Plan / ED Course  I have reviewed the triage vital signs and the nursing notes.  Pertinent labs & imaging results that were available during my care of the patient were reviewed by me and considered in my medical decision making (see chart for details).  17 y.o. F here with concern of hand discoloration.  She was cleaning today with bleach and lysol and afterwards states she noticed a "white film" on her hands.  By time of my  evaluation she states "I think I'm ok now, its going away".  Oral dry skin noted to both palms, particularly in the joint both of her fingers and at the base of the fingers.  I do not see any residue or other skin changes aside from dryness, cleaners likely stripped the oils from her palms adding to dryness which I discussed with her. No evidence of chemical burns.  Denies any burning/itching of the hands. Recommended lotion, vaseline, or other moisturizer.  Can follow-up with PCP.  Discussed plan with patient, he/she acknowledged understanding and agreed with plan of care.  Return precautions given for new or worsening symptoms.  Final Clinical Impressions(s) / ED Diagnoses   Final diagnoses:  Skin problem    ED Discharge Orders    None       Garlon Hatchet, PA-C 11/20/18 2345    Raeford Razor, MD 11/21/18 0011

## 2018-11-20 NOTE — ED Notes (Signed)
Unable to locate pt to provide discharge instructions

## 2018-11-20 NOTE — Discharge Instructions (Signed)
Can use lotion or vaseline to keep hands moisturized. Return here for new concerns.

## 2018-11-20 NOTE — ED Triage Notes (Signed)
Pt presents to ED stating she was cleaning with bleach and Lysol at home today and has a white residue on the fingers on her right hand. Denies pain or itching.

## 2018-11-21 MED ORDER — METRONIDAZOLE 500 MG PO TABS: 500.0000 mg | ORAL_TABLET | Freq: Two times a day (BID) | ORAL | 0 refills | Status: DC

## 2018-11-21 NOTE — Telephone Encounter (Signed)
Spoke with patient. Advised of results as seen below from Dr.Jertson. Patient verbalizes understanding. Rx for Flagyl 500 mg po BID x 7 days #14 0RF sent to pharmacy on file. Avoid alcohol during treatment and 24 hours after completing medication. Don't mix with alcohol if mixed can cause severe nausea, vomiting and abdominal cramping. Patient verbalizes understanding. Encounter closed.

## 2018-11-26 NOTE — Progress Notes (Deleted)
GYNECOLOGY  VISIT   HPI: 17 y.o.   Single Black or African American Not Hispanic or Latino  female   [id number] with Patient's last menstrual period was 11/14/2018 (exact date).   here for left breast recheck.  GYNECOLOGIC HISTORY: Patient's last menstrual period was 11/14/2018 (exact date). Contraception:*** Menopausal hormone therapy: ***        OB History    Gravida  0   Para  0   Term  0   Preterm  0   AB  0   Living  0     SAB  0   TAB  0   Ectopic  0   Multiple  0   Live Births                 Patient Active Problem List   Diagnosis Date Noted  . Dysmenorrhea 03/27/2014    Past Medical History:  Diagnosis Date  . Anemia   . Anxiety   . Depression   . Dysmenorrhea   . Migraine without aura     No past surgical history on file.  Current Outpatient Medications  Medication Sig Dispense Refill  . IRON PO Take by mouth.    . metroNIDAZOLE (FLAGYL) 500 MG tablet Take 1 tablet (500 mg total) by mouth 2 (two) times daily. X 7 days 14 tablet 0  . terbinafine (LAMISIL) 250 MG tablet Take 1 tablet (250 mg total) by mouth daily. 10 tablet 0   No current facility-administered medications for this visit.      ALLERGIES: Bactrim [sulfamethoxazole-trimethoprim]  Family History  Problem Relation Age of Onset  . Endometriosis Mother   . Sarcoidosis Mother   . Heart disease Father     Social History   Socioeconomic History  . Marital status: Single    Spouse name: Not on file  . Number of children: Not on file  . Years of education: Not on file  . Highest education level: Not on file  Occupational History  . Not on file  Social Needs  . Financial resource strain: Not on file  . Food insecurity:    Worry: Not on file    Inability: Not on file  . Transportation needs:    Medical: Not on file    Non-medical: Not on file  Tobacco Use  . Smoking status: Never Smoker  . Smokeless tobacco: Never Used  Substance and Sexual Activity  . Alcohol  use: No  . Drug use: No  . Sexual activity: Yes    Birth control/protection: Condom  Lifestyle  . Physical activity:    Days per week: Not on file    Minutes per session: Not on file  . Stress: Not on file  Relationships  . Social connections:    Talks on phone: Not on file    Gets together: Not on file    Attends religious service: Not on file    Active member of club or organization: Not on file    Attends meetings of clubs or organizations: Not on file    Relationship status: Not on file  . Intimate partner violence:    Fear of current or ex partner: Not on file    Emotionally abused: Not on file    Physically abused: Not on file    Forced sexual activity: Not on file  Other Topics Concern  . Not on file  Social History Narrative  . Not on file    ROS  PHYSICAL EXAMINATION:  LMP 11/14/2018 (Exact Date)     General appearance: alert, cooperative and appears stated age Neck: no adenopathy, supple, symmetrical, trachea midline and thyroid {CHL AMB PHY EX THYROID NORM DEFAULT:[phone]::"normal to inspection and palpation"} Breasts: {Exam; breast:13139::"normal appearance, no masses or tenderness"} Abdomen: soft, non-tender; non distended, no masses,  no organomegaly  Pelvic: External genitalia:  no lesions              Urethra:  normal appearing urethra with no masses, tenderness or lesions              Bartholins and Skenes: normal                 Vagina: normal appearing vagina with normal color and discharge, no lesions              Cervix: {CHL AMB PHY EX CERVIX NORM [phone]::"no lesions"}              Bimanual Exam:  Uterus:  {CHL AMB PHY EX UTERUS NORM [phone]::"normal size, contour, position, consistency, mobility, non-tender"}              Adnexa: {CHL AMB PHY EX ADNEXA NO MASS DEFAULT:[phone]::"no mass, fullness, tenderness"}              Rectovaginal: {yes no:[phone]}.  Confirms.              Anus:  normal sphincter tone, no  lesions  Chaperone was present for exam.  ASSESSMENT     PLAN    An After Visit Summary was printed and given to the patient.  *** minutes face to face time of which over 50% was spent in counseling.

## 2018-11-29 ENCOUNTER — Encounter: Payer: Self-pay | Admitting: Obstetrics and Gynecology

## 2018-11-29 ENCOUNTER — Ambulatory Visit: Payer: 59 | Admitting: Obstetrics and Gynecology

## 2018-11-29 ENCOUNTER — Telehealth: Payer: Self-pay | Admitting: Obstetrics and Gynecology

## 2018-11-29 NOTE — Telephone Encounter (Signed)
[facility] rec[facility] today at 11:30 am. I left her a message to call and reschedule. [facility].

## 2019-02-05 ENCOUNTER — Encounter (HOSPITAL_COMMUNITY): Payer: Self-pay | Admitting: Emergency Medicine

## 2019-02-05 ENCOUNTER — Ambulatory Visit (HOSPITAL_COMMUNITY)
Admission: EM | Admit: 2019-02-05 | Discharge: 2019-02-05 | Disposition: A | Discharge status: Home / Self Care | Payer: 59 | Attending: Family Medicine | Admitting: Family Medicine

## 2019-02-05 DIAGNOSIS — H1131 Conjunctival hemorrhage, right eye: Secondary | ICD-10-CM

## 2019-02-05 DIAGNOSIS — S0591XA Unspecified injury of right eye and orbit, initial encounter: Secondary | ICD-10-CM | POA: Diagnosis not present

## 2019-02-05 MED ORDER — MELOXICAM 7.5 MG PO TABS: 7.5000 mg | ORAL_TABLET | Freq: Every day | ORAL | 0 refills | Status: DC
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
## 2019-02-05 NOTE — Discharge Instructions (Addendum)
No alarming signs on exam.  Continue ice compress to the eye for the next 2 to 3 days, then can switch to warm compress to help with the bruising. Start Mobic. Do not take ibuprofen (motrin/advil)/ naproxen (aleve) while on mobic.  Monitor for worsening symptoms, blurry vision, sensitivity to light, flashes of light/floaters, vision loss, unable to move your eye (cannot look up, down, left, right) follow-up with ophthalmology/emergency department for further evaluation needed.
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
## 2019-03-11 ENCOUNTER — Other Ambulatory Visit: Payer: Self-pay

## 2019-03-11 ENCOUNTER — Encounter (HOSPITAL_COMMUNITY): Payer: Self-pay | Admitting: Emergency Medicine

## 2019-03-11 ENCOUNTER — Emergency Department (HOSPITAL_COMMUNITY)
Admission: EM | Admit: 2019-03-11 | Discharge: 2019-03-11 | Disposition: A | Discharge status: Home / Self Care | Payer: 59 | Attending: Emergency Medicine | Admitting: Emergency Medicine

## 2019-03-11 DIAGNOSIS — F17210 Nicotine dependence, cigarettes, uncomplicated: Secondary | ICD-10-CM | POA: Diagnosis not present

## 2019-03-11 DIAGNOSIS — Z79899 Other long term (current) drug therapy: Secondary | ICD-10-CM | POA: Insufficient documentation

## 2019-03-11 DIAGNOSIS — J029 Acute pharyngitis, unspecified: Secondary | ICD-10-CM | POA: Diagnosis not present

## 2019-03-11 DIAGNOSIS — R070 Pain in throat: Secondary | ICD-10-CM | POA: Diagnosis present

## 2019-03-11 LAB — GROUP A STREP BY PCR: GROUP A STREP BY PCR: NOT DETECTED

## 2019-03-11 MED ORDER — IBUPROFEN 200 MG PO TABS
600.0000 mg | ORAL_TABLET | Freq: Once | ORAL | Status: AC
  Administered 2019-03-11: 600 mg via ORAL
  Filled 2019-03-11: qty 3

## 2019-03-11 NOTE — ED Triage Notes (Signed)
Patient is complaining of a sore throat. She states that is swollen. Patient states she has been sick for 4 days.

## 2019-03-11 NOTE — Discharge Instructions (Addendum)
You may take over-the-counter medicine for symptomatic relief, such as Tylenol, Motrin, TheraFlu, Alka seltzer , black elderberry, etc. Please limit acetaminophen (Tylenol) to 4000 mg and Ibuprofen (Motrin, Advil, etc.) to 2400 mg for a 24hr period. Please note that other over-the-counter medicine may contain acetaminophen or ibuprofen as a component of their ingredients.   

## 2019-03-11 NOTE — ED Provider Notes (Signed)
[facility]-EMERGENCY DEPT Provider Note  CSN: [id number] Arrival date & time: 03/11/19 0422  Chief Complaint(s) Sore Throat  HPI Mercedes Guzman is a 18 y.o. female   The history is provided by the patient.  URI  Presenting symptoms: congestion, cough, fever (subjective) and sore throat   Severity:  Moderate Onset quality:  Gradual Duration:  4 days Timing:  Constant Progression:  Waxing and waning Chronicity:  New Relieved by:  Nothing Worsened by:  Nothing Associated symptoms: no headaches, no myalgias, no neck pain and no wheezing   Risk factors: no diabetes mellitus, no immunosuppression and no recent illness     Past Medical History Past Medical History:  Diagnosis Date  . Anemia   . Anxiety   . Depression   . Dysmenorrhea   . Migraine without aura    Patient Active Problem List   Diagnosis Date Noted  . Dysmenorrhea 03/27/2014   Home Medication(s) Prior to Admission medications   Medication Sig Start Date End Date Taking? Authorizing Provider  meloxicam (MOBIC) 7.5 MG tablet Take 1 tablet (7.5 mg total) by mouth daily. 02/05/19   Belinda Fisher PA-C                                                                                                                                    Past Surgical History History reviewed. No pertinent surgical history. Family History Family History  Problem Relation Age of Onset  . Endometriosis Mother   . Sarcoidosis Mother   . Heart disease Father     Social History Social History   Tobacco Use  . Smoking status: Current Every Day Smoker    Types: Cigarettes  . Smokeless tobacco: Never Used  Substance Use Topics  . Alcohol use: No  . Drug use: No   Allergies Bactrim [sulfamethoxazole-trimethoprim]  Review of Systems Review of Systems  Constitutional: Positive for fever (subjective).  HENT: Positive for congestion and sore throat.   Respiratory: Positive for cough. Negative for wheezing.    Musculoskeletal: Negative for myalgias and neck pain.  Neurological: Negative for headaches.   All other systems are reviewed and are negative for acute change except as noted in the HPI  Physical Exam Vital Signs  I have reviewed the triage vital signs BP 122/87 (BP Location: Left Arm)   Pulse 90   Temp 99.1 F (37.3 C) (Oral)   Resp 13   Ht 5\' 2"  (1.575 m)   Wt 44.5 kg   LMP 03/11/2019   SpO2 98%   BMI 17.92 kg/m   Physical Exam Vitals signs reviewed.  Constitutional:      General: She is not in acute distress.    Appearance: She is well-developed. She is not diaphoretic.  HENT:     Head: Normocephalic and atraumatic.     Nose: Mucosal edema and rhinorrhea present.     Mouth/Throat:     Mouth:  No oral lesions or angioedema.     Pharynx: Pharyngeal swelling and posterior oropharyngeal erythema present. No oropharyngeal exudate.     Tonsils: No tonsillar exudate or tonsillar abscesses. Swelling: 2+ on the right. 2+ on the left.     Comments:   Eyes:     General: No scleral icterus.       Right eye: No discharge.        Left eye: No discharge.     Conjunctiva/sclera: Conjunctivae normal.     Pupils: Pupils are equal, round, and reactive to light.  Neck:     Musculoskeletal: Normal range of motion and neck supple.  Cardiovascular:     Rate and Rhythm: Normal rate and regular rhythm.     Heart sounds: No murmur. No friction rub. No gallop.   Pulmonary:     Effort: Pulmonary effort is normal. No respiratory distress.     Breath sounds: Normal breath sounds. No stridor. No rales.  Abdominal:     General: There is no distension.     Palpations: Abdomen is soft.     Tenderness: There is no abdominal tenderness.  Musculoskeletal:        General: No tenderness.  Skin:    General: Skin is warm and dry.     Findings: No erythema or rash.  Neurological:     Mental Status: She is alert and oriented to person, place, and time.     ED Results and Treatments Labs (all  labs ordered are listed, but only abnormal results are displayed) Labs Reviewed  GROUP A STREP BY PCR                                                                                                                         EKG  EKG Interpretation  Date/Time:    Ventricular Rate:    PR Interval:    QRS Duration:   QT Interval:    QTC Calculation:   R Axis:     Text Interpretation:        Radiology No results found. Pertinent labs & imaging results that were available during my care of the patient were reviewed by me and considered in my medical decision making (see chart for details).  Medications Ordered in ED Medications  ibuprofen (ADVIL,MOTRIN) tablet 600 mg (has no administration in time range)  Procedures Procedures  (including critical care time)  Medical Decision Making / ED Course I have reviewed the nursing notes for this encounter and the patient's prior records (if available in EHR or on provided paperwork).    18 y.o. female presents with cough, rhinorrhea, subjective fever and sore throat for 4 days. adequate oral hydration. Rest of history as above.  Patient appears well. No signs of toxicity. No hypoxia, tachypnea or other signs of respiratory distress. No sign of clinical dehydration. Lung exam clear. Rest of exam as above.  Rapid strep negative.  Most consistent with viral upper respiratory infection.   No evidence suggestive of AOM, PNA, or meningitis.   Chest x-ray not indicated at this time.  Discussed symptomatic treatment with the patient and they will follow closely with their PCP.     Final Clinical Impression(s) / ED Diagnoses Final diagnoses:  Viral pharyngitis   Disposition: Discharge  Condition: Good  I have discussed the results, Dx and Tx plan with the patient who expressed understanding and agree(s)  with the plan. Discharge instructions discussed at great length. The patient was given strict return precautions who verbalized understanding of the instructions. No further questions at time of discharge.    ED Discharge Orders    None       Follow Up: Primary care provider  Schedule an appointment as soon as possible for a visit  If you do not have a primary care physician, contact [facility] at [phone] for referral      This chart was dictated using voice recognition software.  Despite best efforts to proofread,  errors can occur which can change the documentation meaning.   Nira Conn, MD 03/11/19 [phone]

## 2019-04-05 ENCOUNTER — Ambulatory Visit
Admission: RE | Admit: 2019-04-05 | Discharge: 2019-04-05 | Disposition: A | Discharge status: Home / Self Care | Payer: 59 | Source: Ambulatory Visit | Attending: Obstetrics and Gynecology | Admitting: Obstetrics and Gynecology

## 2019-04-05 ENCOUNTER — Other Ambulatory Visit: Payer: Self-pay | Admitting: Obstetrics and Gynecology

## 2019-04-05 ENCOUNTER — Other Ambulatory Visit: Payer: Self-pay

## 2019-04-05 DIAGNOSIS — N63 Unspecified lump in unspecified breast: Secondary | ICD-10-CM

## 2019-04-05 DIAGNOSIS — N6320 Unspecified lump in the left breast, unspecified quadrant: Secondary | ICD-10-CM

## 2019-04-05 DIAGNOSIS — N6310 Unspecified lump in the right breast, unspecified quadrant: Secondary | ICD-10-CM

## 2019-05-24 ENCOUNTER — Telehealth: Payer: Self-pay | Admitting: Obstetrics and Gynecology

## 2019-05-24 NOTE — Telephone Encounter (Signed)
Patient returned call

## 2019-05-24 NOTE — Telephone Encounter (Signed)
Patient says the ibuprofen is not helping for menstrual cramps.

## 2019-05-24 NOTE — Telephone Encounter (Signed)
Left message to call Yashas Camilli, RN at [facility] [phone].   

## 2019-05-24 NOTE — Telephone Encounter (Signed)
Spoke with patient. LMP 05/24/19. No contraceptive, SA. Reports cramps with menses have increased over the last 2 months, currently 10/10. Bleeding has become heavier, changes super plus tampon q3h first 3 days of menses. No relief with Rx 800 mg ibuprofen. Has been taking hot showers and this helps. Reports nausea with pain, no vomiting. Denies fever/chills. No changes in partners, requesting STD testing. 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 Advised OV recommended today for further evaluation, patient declined stating she has a prom to go to later today. OV scheduled for 6/2 at 11:30am with Dr. Oscar La. Advised patient to take UPT to r/o pregnancy. ER precautions provided for new or worsening symptoms. Patient verbalizes understanding.   Routing to provider for final review. Patient is agreeable to disposition. Will close encounter.

## 2019-05-27 ENCOUNTER — Other Ambulatory Visit: Payer: Self-pay

## 2019-05-27 NOTE — Progress Notes (Signed)
GYNECOLOGY  VISIT   HPI: 18 y.o.   Single Black or African American Not Hispanic or Latino  female   G0P0000 with Patient's last menstrual period was 05/24/2019 (exact date).   here for painful menses. Reports cramping is severe for the first 3 days of her menses. Ibuprofen does not relief pain (takes 3 800 mg tablets of ibuprofen at a time). Would like STD testing. 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 Patient has many questions.  She has always had dysmenorrhea, worse in the last few months. She was on OCP's years ago for a few months, went off of them secondary to nausea. Cycles have also gotten heavier. Cycles are monthly x 7-8 days. Heavy for 4 days, can saturate a super + tampon in 2-3 hours. Cramps are severe for the first 3 days. Very difficult to function x 2 days. Sometimes misses work.  She is still with the same partner, mostly uses condoms.  No dyspareunia. She was diagnosed with herpes on a blood test, she wants that retested. She said her partner tested negative, she has never had an outbreak.   GYNECOLOGIC HISTORY: Patient's last menstrual period was 05/24/2019 (exact date). Contraception: Condoms Menopausal hormone therapy: None        OB History    Gravida  0   Para  0   Term  0   Preterm  0   AB  0   Living  0     SAB  0   TAB  0   Ectopic  0   Multiple  0   Live Births                 Patient Active Problem List   Diagnosis Date Noted  . Dysmenorrhea 03/27/2014    Past Medical History:  Diagnosis Date  . Anemia   . Anxiety   . Depression   . Dysmenorrhea   . Migraine without aura   . STD (sexually transmitted disease)    HSV II    History reviewed. No pertinent surgical history.  Current Outpatient Medications  Medication Sig Dispense Refill  . ibuprofen (ADVIL) 200 MG tablet Take 200 mg by mouth every 6 (six) hours as needed.     No current facility-administered medications for this visit.      ALLERGIES:  Bactrim [sulfamethoxazole-trimethoprim]  Family History  Problem Relation Age of Onset  . Endometriosis Mother   . Sarcoidosis Mother   . Heart disease Father     Social History   Socioeconomic History  . Marital status: Single    Spouse name: Not on file  . Number of children: Not on file  . Years of education: Not on file  . Highest education level: Not on file  Occupational History  . Not on file  Social Needs  . Financial resource strain: Not on file  . Food insecurity:    Worry: Not on file    Inability: Not on file  . Transportation needs:    Medical: Not on file    Non-medical: Not on file  Tobacco Use  . Smoking status: Current Every Day Smoker    Types: Cigarettes  . Smokeless tobacco: Never Used  Substance and Sexual Activity  . Alcohol use: No  . Drug use: No  . Sexual activity: Yes    Birth control/protection: Condom  Lifestyle  . Physical activity:    Days per week: Not on file    Minutes per session:  Not on file  . Stress: Not on file  Relationships  . Social connections:    Talks on phone: Not on file    Gets together: Not on file    Attends religious service: Not on file    Active member of club or organization: Not on file    Attends meetings of clubs or organizations: Not on file    Relationship status: Not on file  . Intimate partner violence:    Fear of current or ex partner: Not on file    Emotionally abused: Not on file    Physically abused: Not on file    Forced sexual activity: Not on file  Other Topics Concern  . Not on file  Social History Narrative  . Not on file    Review of Systems  Constitutional: Negative.   HENT: Negative.   Eyes: Negative.   Respiratory: Negative.   Cardiovascular: Negative.   Gastrointestinal: Negative.   Genitourinary:       Painful menses  Musculoskeletal: Negative.   Skin: Negative.   Neurological: Negative.   Endo/Heme/Allergies: Negative.   Psychiatric/Behavioral: Negative.     PHYSICAL  EXAMINATION:    BP 100/70 (BP Location: Right Arm, Patient Position: Sitting, Cuff Size: Normal)   Pulse 76   Temp 97.9 F (36.6 C) (Skin)   Wt 98 lb (44.5 kg)   LMP 05/24/2019 (Exact Date)   BMI 17.92 kg/m     General appearance: alert, cooperative and appears stated age Neck: no adenopathy, supple, symmetrical, trachea midline and thyroid normal to inspection and palpation Abdomen: soft, non-tender; non distended, no masses,  no organomegaly  Pelvic: External genitalia:  no lesions              Urethra:  normal appearing urethra with no masses, tenderness or lesions              Bartholins and Skenes: normal                 Vagina: normal appearing vagina with normal color and discharge, no lesions              Cervix: no cervical motion tenderness and no lesions              Bimanual Exam:  Uterus:  normal size, contour, position, consistency, mobility, non-tender and retroverted              Adnexa: no mass, fullness, tenderness               Chaperone was present for exam.  ASSESSMENT Worsening severe dysmenorrhea Cycles are heavier Screening STD, wants recheck for hsv Contraception    PLAN Discussed options for treatment, OCP's, depo-provera, mirena IUD, nuvaring She would like the mirena for contraception and to help with her cramps CBC, TSH STD testing   An After Visit Summary was printed and given to the patient.  ~20 minutes face to face time of which over 50% was spent in counseling.

## 2019-05-28 ENCOUNTER — Ambulatory Visit (INDEPENDENT_AMBULATORY_CARE_PROVIDER_SITE_OTHER): Payer: 59 | Admitting: Obstetrics and Gynecology

## 2019-05-28 ENCOUNTER — Encounter: Payer: Self-pay | Admitting: Obstetrics and Gynecology

## 2019-05-28 VITALS — BP 100/70 | HR 76 | Temp 97.9°F | Wt 98.0 lb

## 2019-05-28 DIAGNOSIS — N946 Dysmenorrhea, unspecified: Secondary | ICD-10-CM | POA: Diagnosis not present

## 2019-05-28 DIAGNOSIS — Z3009 Encounter for other general counseling and advice on contraception: Secondary | ICD-10-CM | POA: Diagnosis not present

## 2019-05-28 DIAGNOSIS — N920 Excessive and frequent menstruation with regular cycle: Secondary | ICD-10-CM | POA: Diagnosis not present

## 2019-05-28 DIAGNOSIS — Z113 Encounter for screening for infections with a predominantly sexual mode of transmission: Secondary | ICD-10-CM | POA: Diagnosis not present

## 2019-05-29 ENCOUNTER — Telehealth: Payer: Self-pay | Admitting: Obstetrics and Gynecology

## 2019-05-29 ENCOUNTER — Other Ambulatory Visit: Payer: Self-pay

## 2019-05-29 ENCOUNTER — Encounter: Payer: Self-pay | Admitting: Obstetrics and Gynecology

## 2019-05-29 ENCOUNTER — Ambulatory Visit (INDEPENDENT_AMBULATORY_CARE_PROVIDER_SITE_OTHER): Payer: 59 | Admitting: Obstetrics and Gynecology

## 2019-05-29 VITALS — BP 100/60 | HR 88 | Temp 98.0°F | Wt 97.8 lb

## 2019-05-29 DIAGNOSIS — B009 Herpesviral infection, unspecified: Secondary | ICD-10-CM

## 2019-05-29 DIAGNOSIS — Z3009 Encounter for other general counseling and advice on contraception: Secondary | ICD-10-CM

## 2019-05-29 DIAGNOSIS — N920 Excessive and frequent menstruation with regular cycle: Secondary | ICD-10-CM

## 2019-05-29 DIAGNOSIS — N946 Dysmenorrhea, unspecified: Secondary | ICD-10-CM

## 2019-05-29 LAB — CBC
Hematocrit: 34.2 % (ref 34.0–46.6)
Hemoglobin: 11.2 g/dL (ref 11.1–15.9)
MCH: 29.5 pg (ref 26.6–33.0)
MCHC: 32.7 g/dL (ref 31.5–35.7)
MCV: 90 fL (ref 79–97)
Platelets: 267 10*3/uL (ref 150–450)
RBC: 3.8 x10E6/uL (ref 3.77–5.28)
RDW: 13.5 % (ref 11.7–15.4)
WBC: 4.7 10*3/uL (ref 3.4–10.8)

## 2019-05-29 LAB — HEP, RPR, HIV PANEL
HIV Screen 4th Generation wRfx: NONREACTIVE
Hepatitis B Surface Ag: NEGATIVE
RPR Ser Ql: NONREACTIVE

## 2019-05-29 LAB — TSH: TSH: 1.71 u[IU]/mL (ref 0.450–4.500)

## 2019-05-29 LAB — HSV(HERPES SIMPLEX VRS) I + II AB-IGG
HSV 1 Glycoprotein G Ab, IgG: <0.91 index (ref 0.00–0.90)
HSV 2 IgG, Type Spec: 9.54 index — ABNORMAL HIGH (ref 0.00–0.90)

## 2019-05-29 LAB — POCT URINE PREGNANCY: Preg Test, Ur: NEGATIVE

## 2019-05-29 LAB — HEPATITIS C ANTIBODY: Hep C Virus Ab: <0.1 s/co ratio (ref 0.0–0.9)

## 2019-05-29 NOTE — Progress Notes (Signed)
GYNECOLOGY  VISIT   HPI: 18 y.o.   Single Black or African American Not Hispanic or Latino  female   G0P0000 with Patient's last menstrual period was 05/24/2019 (exact date).   here for Mirena IUD insertion.  STD testing from yesterday is pending.  UPT: negative  GYNECOLOGIC HISTORY: Patient's last menstrual period was 05/24/2019 (exact date). Contraception:Condoms Menopausal hormone therapy: None        OB History    Gravida  0   Para  0   Term  0   Preterm  0   AB  0   Living  0     SAB  0   TAB  0   Ectopic  0   Multiple  0   Live Births                 Patient Active Problem List   Diagnosis Date Noted  . Dysmenorrhea 03/27/2014    Past Medical History:  Diagnosis Date  . Anemia   . Anxiety   . Depression   . Dysmenorrhea   . Migraine without aura   . STD (sexually transmitted disease)    HSV II    History reviewed. No pertinent surgical history.  Current Outpatient Medications  Medication Sig Dispense Refill  . ibuprofen (ADVIL) 200 MG tablet Take 200 mg by mouth every 6 (six) hours as needed.     No current facility-administered medications for this visit.      ALLERGIES: Bactrim [sulfamethoxazole-trimethoprim]  Family History  Problem Relation Age of Onset  . Endometriosis Mother   . Sarcoidosis Mother   . Heart disease Father     Social History   Socioeconomic History  . Marital status: Single    Spouse name: Not on file  . Number of children: Not on file  . Years of education: Not on file  . Highest education level: Not on file  Occupational History  . Not on file  Social Needs  . Financial resource strain: Not on file  . Food insecurity:    Worry: Not on file    Inability: Not on file  . Transportation needs:    Medical: Not on file    Non-medical: Not on file  Tobacco Use  . Smoking status: Current Every Day Smoker    Types: Cigarettes  . Smokeless tobacco: Never Used  Substance and Sexual Activity  .  Alcohol use: No  . Drug use: No  . Sexual activity: Yes    Birth control/protection: Condom  Lifestyle  . Physical activity:    Days per week: Not on file    Minutes per session: Not on file  . Stress: Not on file  Relationships  . Social connections:    Talks on phone: Not on file    Gets together: Not on file    Attends religious service: Not on file    Active member of club or organization: Not on file    Attends meetings of clubs or organizations: Not on file    Relationship status: Not on file  . Intimate partner violence:    Fear of current or ex partner: Not on file    Emotionally abused: Not on file    Physically abused: Not on file    Forced sexual activity: Not on file  Other Topics Concern  . Not on file  Social History Narrative  . Not on file    Review of Systems  Constitutional: Negative.   HENT: Negative.  Eyes: Negative.   Respiratory: Negative.   Cardiovascular: Negative.   Gastrointestinal: Negative.   Genitourinary: Negative.   Musculoskeletal: Negative.   Skin: Negative.   Neurological: Negative.   Endo/Heme/Allergies: Negative.   Psychiatric/Behavioral: Negative.     PHYSICAL EXAMINATION:    BP 100/60 (BP Location: Left Arm, Patient Position: Sitting, Cuff Size: Normal)   Pulse 88   Temp 98 F (36.7 C) (Skin)   Wt 97 lb 12.8 oz (44.4 kg)   LMP 05/24/2019 (Exact Date)   BMI 17.89 kg/m     General appearance: alert, cooperative and appears stated age  Pelvic: External genitalia:  no lesions              Urethra:  normal appearing urethra with no masses, tenderness or lesions              Bartholins and Skenes: normal                 Vagina: normal appearing vagina with normal color and discharge, no lesions              Cervix: no lesions  The risks of the mirena IUD were reviewed with the patient, including infection, abnormal bleeding and uterine perfortion. Consent was signed.  A speculum was placed in the vagina, the cervix was  cleansed with betadine. A tenaculum was placed on the cervix, the uterus sounded to 7 cm. The cervix was dilated to a 5 hagar dilator  The mirena IUD was inserted without difficulty. The string were cut to 3-4 cm. The tenaculum was removed. Slight oozing from the tenaculum site was stopped with pressure.   The patient tolerated the procedure well.    Chaperone was present for exam.  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

## 2019-05-29 NOTE — Patient Instructions (Addendum)
IUD Post-procedure Instructions . Cramping is common.  You may take Ibuprofen, Aleve, or Tylenol for the cramping.  This should resolve within 24 hours.   . You may have a small amount of spotting.  You should wear a mini pad for the next few days. . You may have intercourse in 24 hours. . You need to call the office if you have any pelvic pain, fever, heavy bleeding, or foul smelling vaginal discharge. . Shower or bathe as normal . Use a back up method of contraception for one week . Continue to use condoms for STD protection  Genital Herpes Genital herpes is a common sexually transmitted infection (STI) that is caused by a virus. The virus spreads from person to person through sexual contact. Infection can cause itching, blisters, and sores around the genitals or rectum. Symptoms may last several days and then go away This is called an outbreak. However, the virus remains in your body, so you may have more outbreaks in the future. The time between outbreaks varies and can be months or years. Genital herpes affects men and women. It is particularly concerning for pregnant women because the virus can be passed to the baby during delivery and can cause serious problems. Genital herpes is also a concern for people who have a weak disease-fighting (immune) system. What are the causes? This condition is caused by the herpes simplex virus (HSV) type 1 or type 2. The virus may spread through:  Sexual contact with an infected person, including vaginal, anal, and oral sex.  Contact with fluid from a herpes sore.  The skin. This means that you can get herpes from an infected partner even if he or she does not have a visible sore or does not know that he or she is infected. What increases the risk? You are more likely to develop this condition if:  You have sex with many partners.  You do not use latex condoms during sex. What are the signs or symptoms? Most people do not have symptoms  (asymptomatic) or have mild symptoms that may be mistaken for other skin problems. Symptoms may include:  Small red bumps near the genitals, rectum, or mouth. These bumps turn into blisters and then turn into sores.  Flu-like symptoms, including: ? Fever. ? Body aches. ? Swollen lymph nodes. ? Headache.  Painful urination.  Pain and itching in the genital area or rectal area.  Vaginal discharge.  Tingling or shooting pain in the legs and buttocks. Generally, symptoms are more severe and last longer during the first (primary) outbreak. Flu-like symptoms are also more common during the primary outbreak. How is this diagnosed? Genital herpes may be diagnosed based on:  A physical exam.  Your medical history.  Blood tests.  A test of a fluid sample (culture) from an open sore. How is this treated? There is no cure for this condition, but treatment with antiviral medicines that are taken by mouth (orally) can do the following:  Speed up healing and relieve symptoms.  Help to reduce the spread of the virus to sexual partners.  Limit the chance of future outbreaks, or make future outbreaks shorter.  Lessen symptoms of future outbreaks. Your health care provider may also recommend pain relief medicines, such as aspirin or ibuprofen. Follow these instructions at home: Sexual activity  Do not have sexual contact during active outbreaks.  Practice safe sex. Latex condoms and female condoms may help prevent the spread of the herpes virus. General instructions  Keep  the affected areas dry and clean.  Take over-the-counter and prescription medicines only as told by your health care provider.  Avoid rubbing or touching blisters and sores. If you do touch blisters or sores: ? Wash your hands thoroughly with soap and water. ? Do not touch your eyes afterward.  To help relieve pain or itching, you may take the following actions as directed by your health care provider: ? Apply a  cold, wet cloth (cold compress) to affected areas 4-6 times a day. ? Apply a substance that protects your skin and reduces bleeding (astringent). ? Apply a gel that helps relieve pain around sores (lidocaine gel). ? Take a warm, shallow bath that cleans the genital area (sitz bath).  Keep all follow-up visits as told by your health care provider. This is important. How is this prevented?  Use condoms. Although anyone can get genital herpes during sexual contact, even with the use of a condom, a condom can provide some protection.  Avoid having multiple sexual partners.  Talk with your sexual partner about any symptoms either of you may have. Also, talk with your partner about any history of STIs.  Get tested for STIs before you have sex. Ask your partner to do the same.  Do not have sexual contact if you have symptoms of genital herpes. Contact a health care provider if:  Your symptoms are not improving with medicine.  Your symptoms return.  You have new symptoms.  You have a fever.  You have abdominal pain.  You have redness, swelling, or pain in your eye.  You notice new sores on other parts of your body.  You are a woman and experience bleeding between menstrual periods.  You have had herpes and you become pregnant or plan to become pregnant. Summary  Genital herpes is a common sexually transmitted infection (STI) that is caused by the herpes simplex virus (HSV) type 1 or type 2.  These viruses are most often spread through sexual contact with an infected person.  You are more likely to develop this condition if you have sex with many partners or you have unprotected sex.  Most people do not have symptoms (asymptomatic) or have mild symptoms that may be mistaken for other skin problems. Symptoms occur as outbreaks that may happen months or years apart.  There is no cure for this condition, but treatment with oral antiviral medicines can reduce symptoms, reduce the  chance of spreading the virus to a partner, prevent future outbreaks, or shorten future outbreaks. This information is not intended to replace advice given to you by your health care provider. Make sure you discuss any questions you have with your health care provider. Document Released: 12/09/2000 Document Revised: 11/11/2016 Document Reviewed: 11/11/2016 Elsevier Interactive Patient Education  2019 [employer]

## 2019-05-29 NOTE — Telephone Encounter (Signed)
Patient is scheduled for iud insertion today but her period went off last night.

## 2019-05-29 NOTE — Telephone Encounter (Signed)
Spoke with patient. LMP 05/24/19. Scheduled for IUD insertion today, is no longer bleeding, asking if she should reschedule? Patient has not been SA since menses started. Advised still within 7 days start of menses, ok to keep IUD insertion as scheduled for today. Patient verbalizes understanding.   Routing to provider for final review. Patient is agreeable to disposition. Will close encounter.

## 2019-05-30 ENCOUNTER — Telehealth: Payer: Self-pay | Admitting: Obstetrics and Gynecology

## 2019-05-30 DIAGNOSIS — R102 Pelvic and perineal pain unspecified side: Secondary | ICD-10-CM

## 2019-05-30 DIAGNOSIS — Z30431 Encounter for routine checking of intrauterine contraceptive device: Secondary | ICD-10-CM

## 2019-05-30 NOTE — Telephone Encounter (Signed)
Called patient and left message for her to call me.

## 2019-05-30 NOTE — Telephone Encounter (Signed)
Patient had Mirena placed yesterday. She is cramping and spotting and would like to know if it is normal.

## 2019-05-30 NOTE — Telephone Encounter (Signed)
Please put her in for an ultrasound

## 2019-05-30 NOTE — Telephone Encounter (Signed)
Called and left patient a message stating Dr.Jertson would like her to come to office today for PUS, please call to discuss.

## 2019-05-30 NOTE — Telephone Encounter (Signed)
Patient states had Mirena IUD inserted yesterday. She is still having some spotting and some moderate cramping. On a scale from 1-10 she would rate her cramping between a 5-6. She states about the same as it was after IUD insertion yesterday. She is taking 800mg  of Ibuprofen and it helps some but doesn't totally relieve cramping. Advised patient she could have spotting for first 3-6 months of IUD insertion. Advised cramping should improve in next few days. Ok to use heating pad. Denies any fever or severe pelvic pain. She will continue with 800mg  Ibuprofen 3x/day. I will discuss further recommendations with provider and call her back. Routed to provider.

## 2019-05-31 NOTE — Telephone Encounter (Signed)
Spoke with patient. She states she is feeling much better today. No cramping or pelvic pain. She states she didn't return my calls yesterday because she was working. Advised to call if develops any pelvic pain or fever. She thanked me for calling. Routed to provider  Encounter closed

## 2019-06-05 LAB — CHLAMYDIA/GONOCOCCUS/TRICHOMONAS, NAA
Chlamydia by NAA: NEGATIVE
Gonococcus by NAA: NEGATIVE
Trich vag by NAA: NEGATIVE

## 2019-07-01 ENCOUNTER — Ambulatory Visit: Payer: 59 | Admitting: Obstetrics and Gynecology

## 2019-07-02 ENCOUNTER — Encounter: Payer: Self-pay | Admitting: Obstetrics and Gynecology

## 2019-07-02 ENCOUNTER — Other Ambulatory Visit: Payer: Self-pay

## 2019-07-02 ENCOUNTER — Ambulatory Visit (INDEPENDENT_AMBULATORY_CARE_PROVIDER_SITE_OTHER): Payer: 59 | Admitting: Obstetrics and Gynecology

## 2019-07-02 VITALS — BP 108/76 | HR 80 | Temp 98.1°F | Wt 95.4 lb

## 2019-07-02 DIAGNOSIS — Z30431 Encounter for routine checking of intrauterine contraceptive device: Secondary | ICD-10-CM | POA: Diagnosis not present

## 2019-07-02 NOTE — Progress Notes (Signed)
GYNECOLOGY  VISIT   HPI: 18 y.o.   Single Black or African American Not Hispanic or Latino  female   [id number] with Patient's last menstrual period was 06/20/2019 (exact date).   here for IUD check.  She had a mirena IUD inserted last month. She has been spotting since insertion. She has had one cycle since insertion, lighter and less cramping than normal. No dyspareunia.    GYNECOLOGIC HISTORY: Patient's last menstrual period was 06/20/2019 (exact date). Contraception: IUD Menopausal hormone therapy: None        OB History    Gravida  0   Para  0   Term  0   Preterm  0   AB  0   Living  0     SAB  0   TAB  0   Ectopic  0   Multiple  0   Live Births                 Patient Active Problem List   Diagnosis Date Noted  . Dysmenorrhea 03/27/2014    Past Medical History:  Diagnosis Date  . Anemia   . Anxiety   . Depression   . Dysmenorrhea   . Migraine without aura   . STD (sexually transmitted disease)    HSV II    History reviewed. No pertinent surgical history.  Current Outpatient Medications  Medication Sig Dispense Refill  . ibuprofen (ADVIL) 200 MG tablet Take 200 mg by mouth every 6 (six) hours as needed.    Marland Kitchen. levonorgestrel (MIRENA) 20 MCG/24HR IUD 1 each by Intrauterine route once.     No current facility-administered medications for this visit.      ALLERGIES: Bactrim [sulfamethoxazole-trimethoprim]  Family History  Problem Relation Age of Onset  . Endometriosis Mother   . Sarcoidosis Mother   . Heart disease Father     Social History   Socioeconomic History  . Marital status: Single    Spouse name: Not on file  . Number of children: Not on file  . Years of education: Not on file  . Highest education level: Not on file  Occupational History  . Not on file  Social Needs  . Financial resource strain: Not on file  . Food insecurity    Worry: Not on file    Inability: Not on file  . Transportation needs    Medical: Not on file     Non-medical: Not on file  Tobacco Use  . Smoking status: Current Every Day Smoker    Types: Cigarettes  . Smokeless tobacco: Never Used  Substance and Sexual Activity  . Alcohol use: No  . Drug use: No  . Sexual activity: Yes    Birth control/protection: I.U.D., Condom  Lifestyle  . Physical activity    Days per week: Not on file    Minutes per session: Not on file  . Stress: Not on file  Relationships  . Social Musicianconnections    Talks on phone: Not on file    Gets together: Not on file    Attends religious service: Not on file    Active member of club or organization: Not on file    Attends meetings of clubs or organizations: Not on file    Relationship status: Not on file  . Intimate partner violence    Fear of current or ex partner: Not on file    Emotionally abused: Not on file    Physically abused: Not on file  Forced sexual activity: Not on file  Other Topics Concern  . Not on file  Social History Narrative  . Not on file    Review of Systems  Constitutional: Negative.   HENT: Negative.   Eyes: Negative.   Respiratory: Negative.   Cardiovascular: Negative.   Gastrointestinal: Negative.   Genitourinary:       Spotting with IUD  Musculoskeletal: Negative.   Skin: Negative.   Neurological: Negative.   Endo/Heme/Allergies: Negative.   Psychiatric/Behavioral: Negative.     PHYSICAL EXAMINATION:    BP 108/76 (BP Location: Right Arm, Patient Position: Sitting, Cuff Size: Normal)   Pulse 80   Temp 98.1 F (36.7 C) (Skin)   Wt 95 lb 6.4 oz (43.3 kg)   LMP 06/20/2019 (Exact Date)   BMI 17.45 kg/m     General appearance: alert, cooperative and appears stated age  Pelvic: External genitalia:  no lesions              Urethra:  normal appearing urethra with no masses, tenderness or lesions              Bartholins and Skenes: normal                 Vagina: normal appearing vagina with normal color and discharge, no lesions              Cervix: no lesions  and IUD strings 2-3 cm              Bimanual Exam:  Uterus:  normal size, contour, position, consistency, mobility, non-tender              Adnexa: no mass, fullness, tenderness                Chaperone was present for exam.  ASSESSMENT IUD check, doing well    PLAN F/U in 9/20 for an annual exam   An After Visit Summary was printed and given to the patient.

## 2019-08-21 ENCOUNTER — Telehealth: Payer: Self-pay | Admitting: Obstetrics and Gynecology

## 2019-08-21 NOTE — Telephone Encounter (Signed)
Message left to return call to Triage Nurse at [phone].    

## 2019-08-21 NOTE — Telephone Encounter (Signed)
The bleeding can go on for at least 3 months, but should get lighter. If she is having significant pain, I would recommend an ultrasound at her visit if we can arrange it.

## 2019-08-21 NOTE — Telephone Encounter (Signed)
Call to patient. Message given to patient as seen below from Dr. Talbert Nan and patient verbalized understanding. Patient will call back to schedule once she coordinates her dentist appointment.   Routing to provider and will close encounter.

## 2019-08-21 NOTE — Telephone Encounter (Signed)
Patient is returning call to Emily. °

## 2019-08-21 NOTE — Telephone Encounter (Signed)
Patient is calling due to cramps and bleeding. Patient stated that she has not stopped bleeding since she had IUD insertion on 05/29/2019.

## 2019-08-21 NOTE — Telephone Encounter (Signed)
Returned call to patient. Mirena IUD placed on 05-29-2019. Patient states that she has not stopped bleeding since it was placed. States the flow is a mixture from light to heavy. Patient states the bleeding today is like a regular flow. Patient complaining of cramping pain that she rates a 7/10 that has been ongoing for a week straight. Not taking any pain medication, but using a heating pad which helps some. Denies fever, N/V, HA, Shortness of breath or light- headedness. Patient states she does notice when she takes her tampon out that there is an odor. RN advised OV recommended. Patient declines to schedule. States she has moved to [place], [place] and doesn't have a provider there yet. Patient states she will be in town hopefully next week for a dentist appointment and will call to schedule an appointment to see Dr. Talbert Nan. RN reviewed ER/UC precautions and advised patient to seek care if pain worsens, bleeding to where she is soaking a pad/tampon every 1-2 hours or if she develops a fever. Patient verbalized understanding and agreeable.   Routing to provider and will close encounter.

## 2019-09-01 IMAGING — US ULTRASOUND RIGHT BREAST LIMITED
1 series · 5 of 5 positions shown · non-contrast
Comparison: Previous exam(s).

CLINICAL DATA: 17-year-old female presenting for six-month
follow-up of probably benign bilateral masses.

EXAM:
ULTRASOUND OF THE BILATERAL BREAST

[Series 1: ultrasound right breast limited · 0.05mm/px · 5 of 5 slices shown]
[im 1/5]
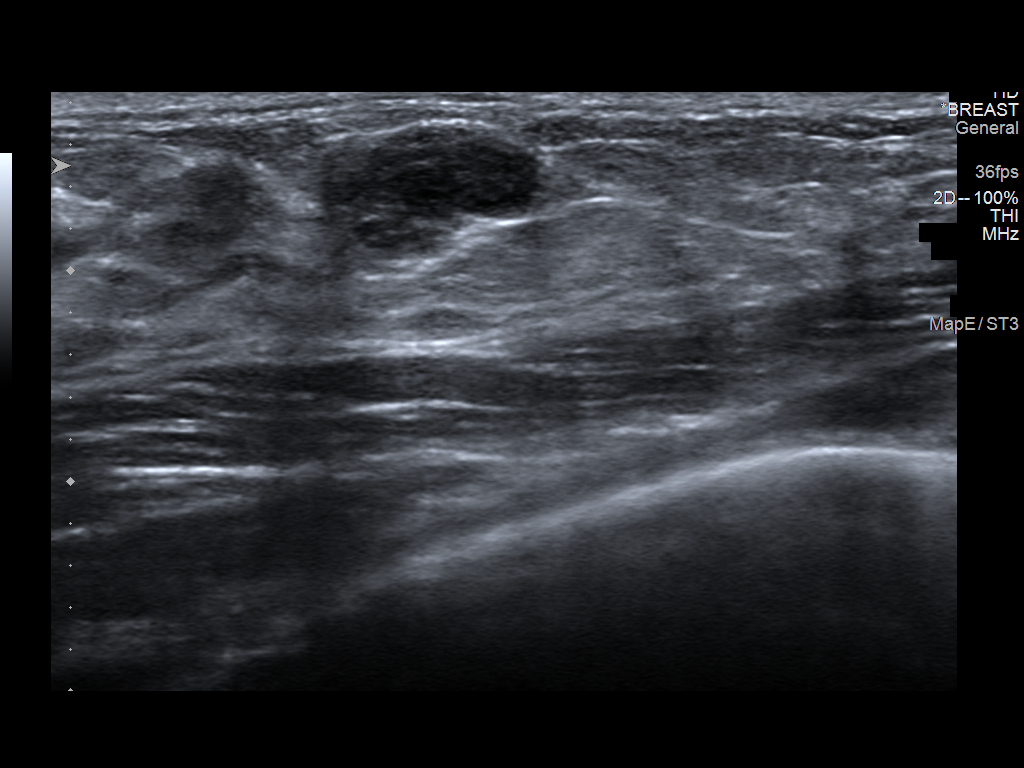
[im 2/5]
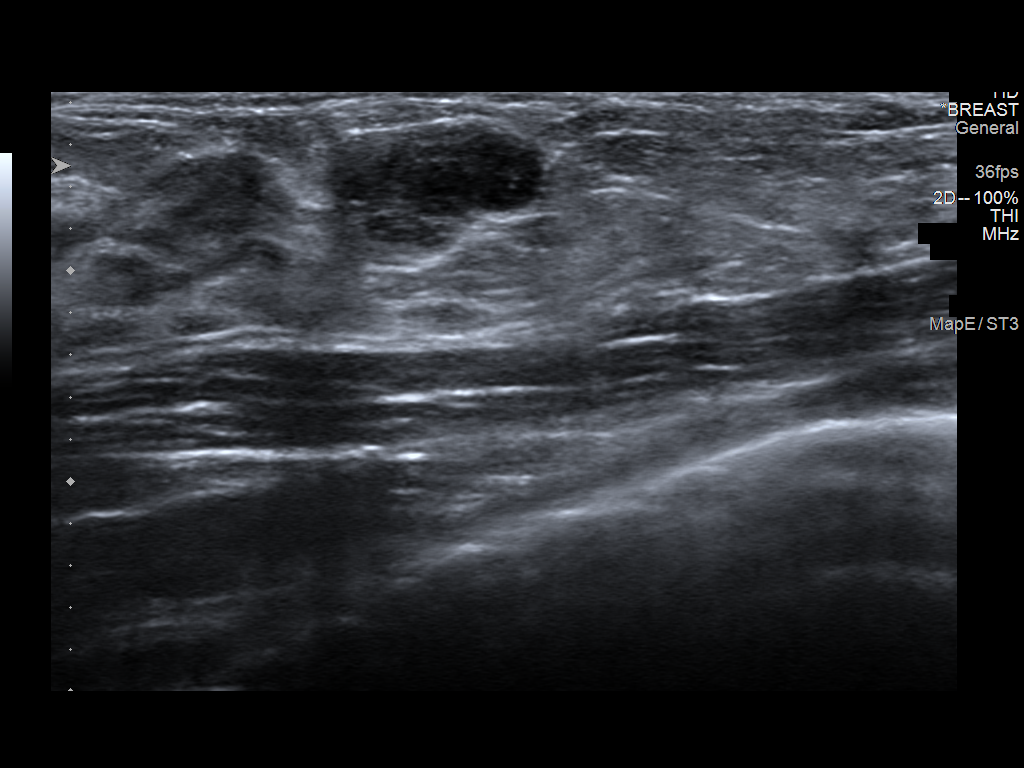
[im 3/5]
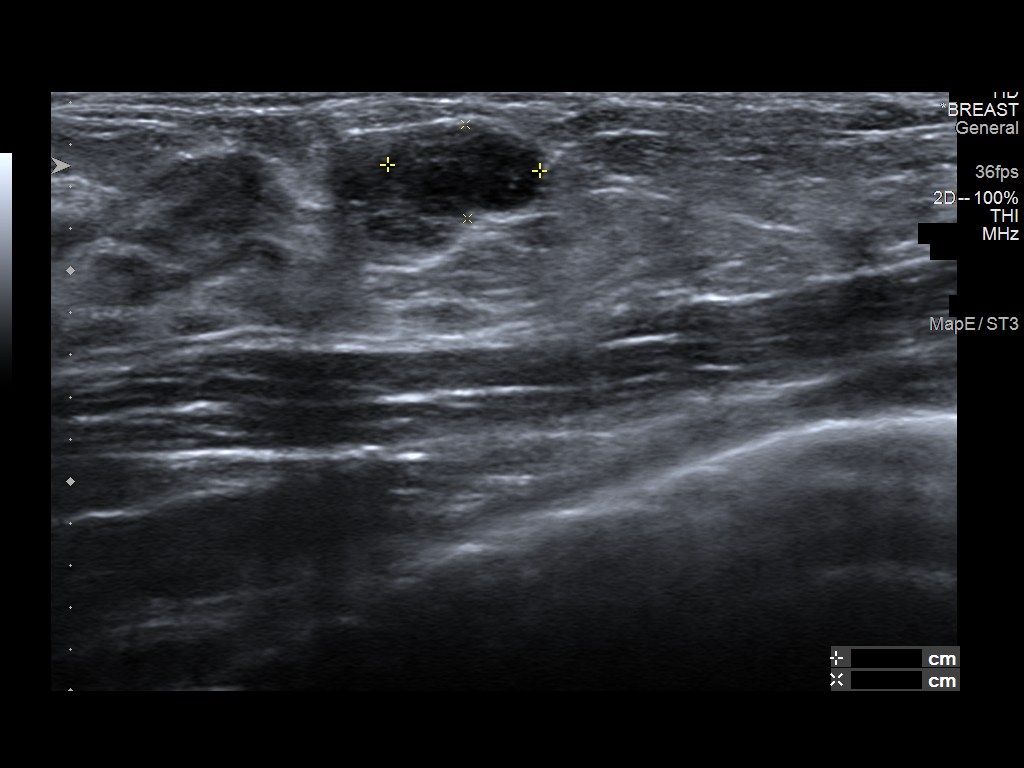
[im 4/5]
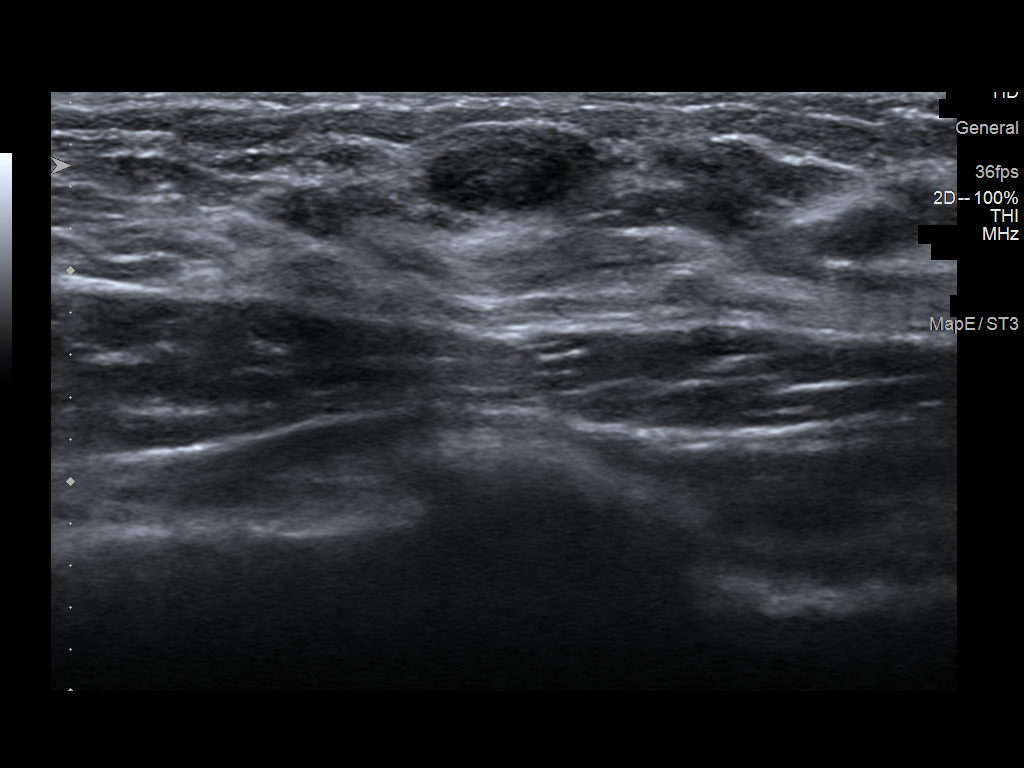
[im 5/5]
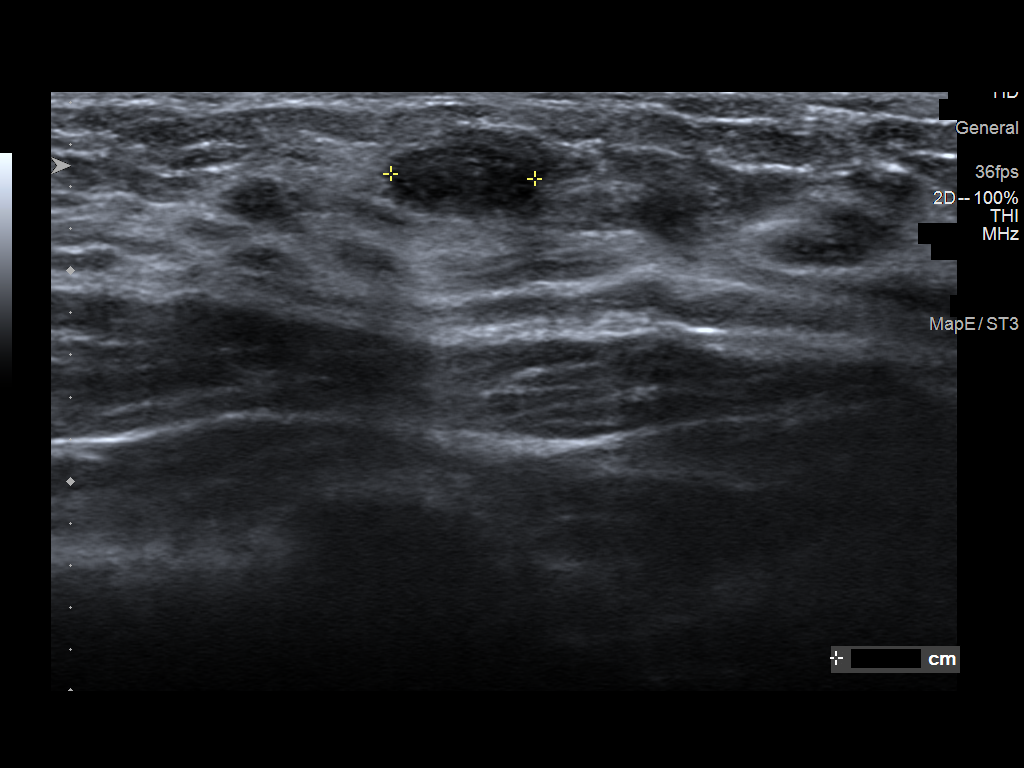

[5 of 5 positions shown; findings below may reference images not displayed]

FINDINGS: Targeted ultrasound is performed, showing stable appearance of an
oval, circumscribed hypoechoic mass at the 1 o'clock position 5 cm
from the nipple. It measures 7 x 7 x 5 mm (previously 8 x 6 x 4 mm).

Within the left breast, 2 stable masses are also identified. This
includes a mass at the 10 o'clock position 5 cm from the nipple
measuring 1.7 x 1.6 x 0.7 cm (previously 1.7 x 1.6 x 0.8 cm) and a
mass at the 10 o'clock position 6 cm from the nipple measuring 1 x 1
x 0.4 cm (previously 1.1 x 1.1 x 0.4 cm).

Slight differences in size are likely due to positioning and
technique.
IMPRESSION: Stable, probably benign bilateral breast masses. Recommend continued
ultrasound follow-up in 6 months.

RECOMMENDATION:
Bilateral ultrasound in 6 months.

I have discussed the findings and recommendations with the patient.
Results were also provided in writing at the conclusion of the
visit. If applicable, a reminder letter will be sent to the patient
regarding the next appointment.

BI-RADS CATEGORY  3: Probably benign.

## 2019-09-01 IMAGING — US ULTRASOUND LEFT BREAST LIMITED
1 series · 8 of 8 positions shown · non-contrast
Comparison: Previous exam(s).

CLINICAL DATA: 17-year-old female presenting for six-month
follow-up of probably benign bilateral masses.

EXAM:
ULTRASOUND OF THE BILATERAL BREAST

[Series 1: ultrasound left breast limited · 0.06mm/px · 8 of 8 slices shown]
[im 1/8]
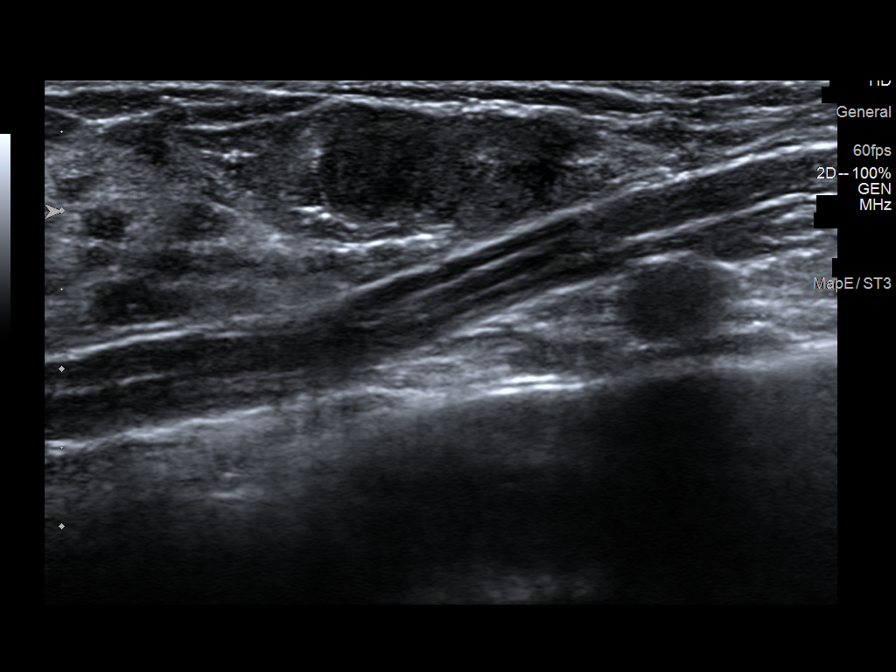
[im 2/8]
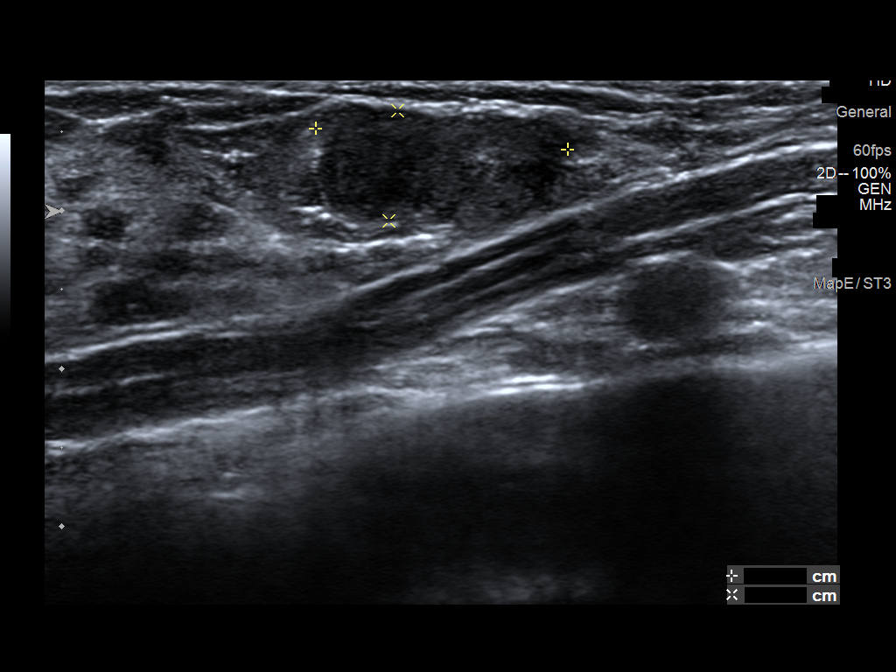
[im 3/8]
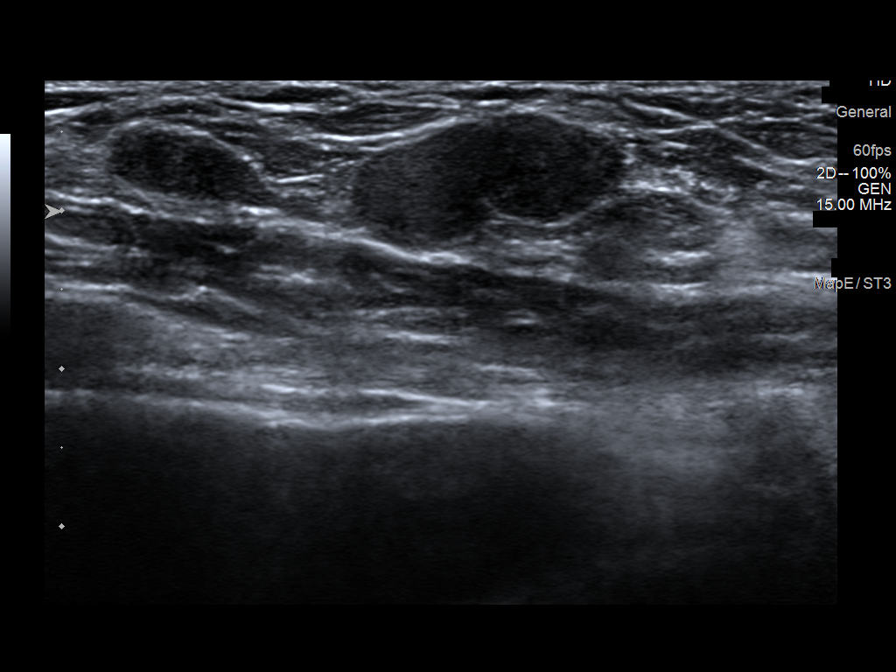
[im 4/8]
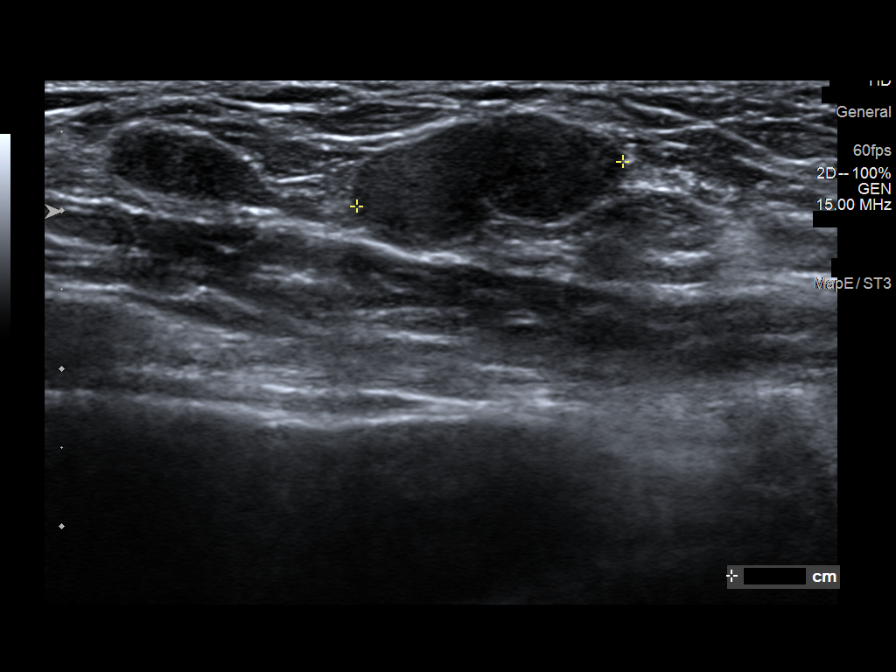
[im 5/8]
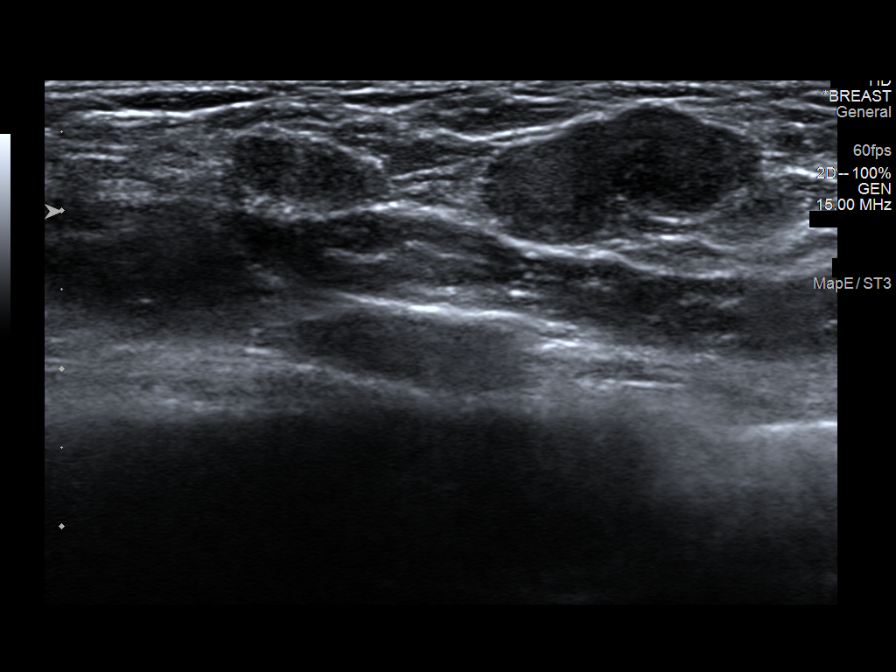
[im 6/8]
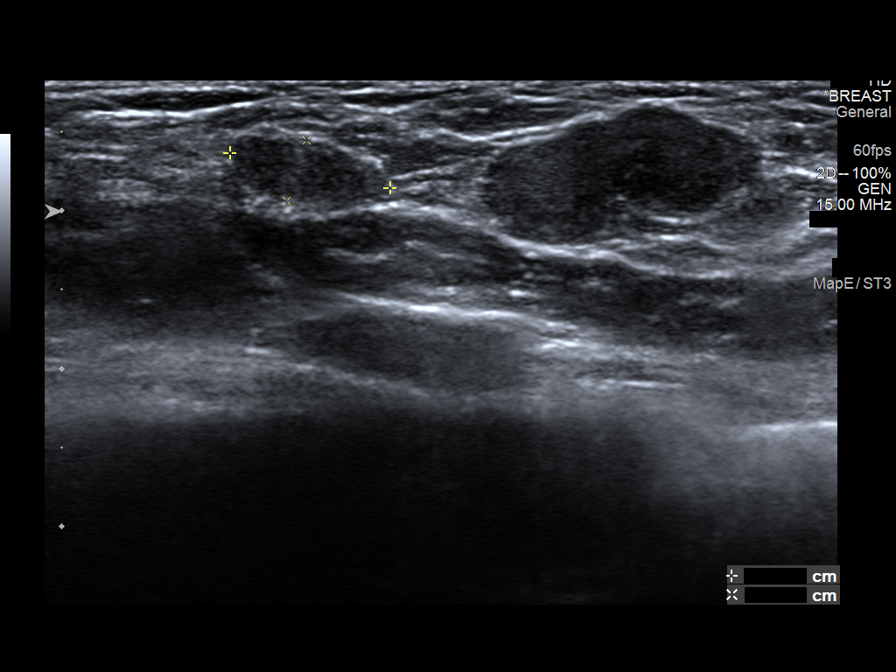
[im 7/8]
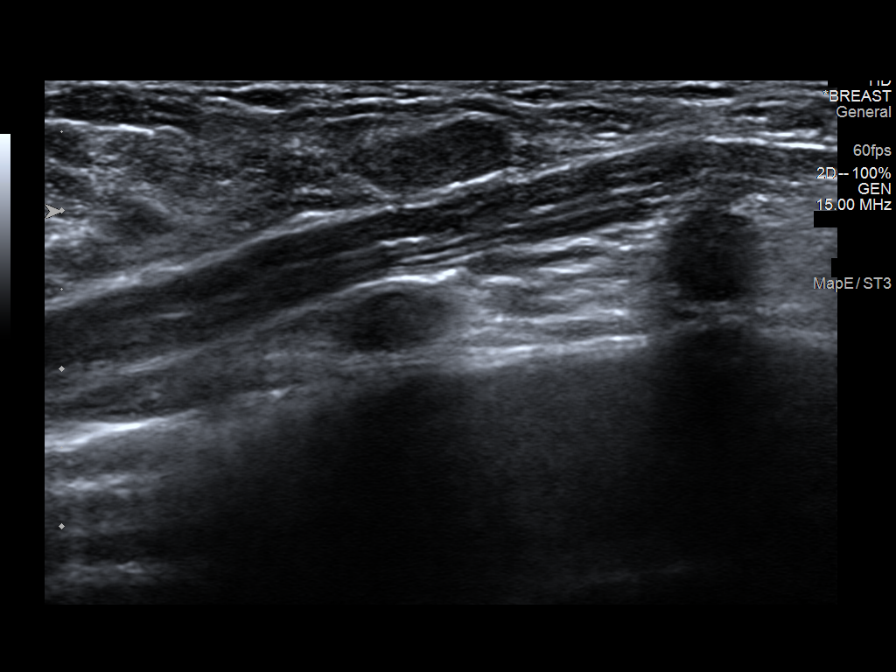
[im 8/8]
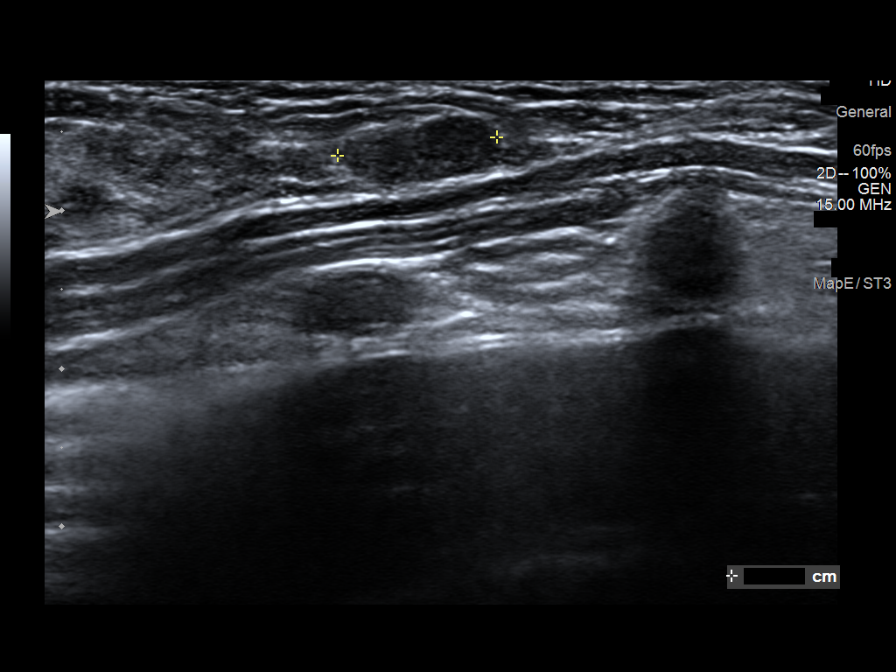

[8 of 8 positions shown; findings below may reference images not displayed]

FINDINGS: Targeted ultrasound is performed, showing stable appearance of an
oval, circumscribed hypoechoic mass at the 1 o'clock position 5 cm
from the nipple. It measures 7 x 7 x 5 mm (previously 8 x 6 x 4 mm).

Within the left breast, 2 stable masses are also identified. This
includes a mass at the 10 o'clock position 5 cm from the nipple
measuring 1.7 x 1.6 x 0.7 cm (previously 1.7 x 1.6 x 0.8 cm) and a
mass at the 10 o'clock position 6 cm from the nipple measuring 1 x 1
x 0.4 cm (previously 1.1 x 1.1 x 0.4 cm).

Slight differences in size are likely due to positioning and
technique.
IMPRESSION: Stable, probably benign bilateral breast masses. Recommend continued
ultrasound follow-up in 6 months.

RECOMMENDATION:
Bilateral ultrasound in 6 months.

I have discussed the findings and recommendations with the patient.
Results were also provided in writing at the conclusion of the
visit. If applicable, a reminder letter will be sent to the patient
regarding the next appointment.

BI-RADS CATEGORY  3: Probably benign.

## 2019-09-19 ENCOUNTER — Ambulatory Visit: Payer: 59 | Admitting: Obstetrics and Gynecology

## 2019-09-23 ENCOUNTER — Telehealth: Payer: Self-pay | Admitting: Obstetrics and Gynecology

## 2019-09-23 NOTE — Telephone Encounter (Addendum)
Spoke with patient. Patient states she is at the dentist, she will return call 9/29 to further discuss.

## 2019-09-23 NOTE — Telephone Encounter (Signed)
Patient is asking to come in for a follow up regarding her IUD.She would like to come in tomorrow if possible.

## 2019-09-25 NOTE — Telephone Encounter (Signed)
Left message to call Krew Hortman, RN at [facility] [phone].   

## 2019-10-07 ENCOUNTER — Inpatient Hospital Stay: Admission: RE | Admit: 2019-10-07 | Payer: 59 | Source: Ambulatory Visit

## 2019-10-22 ENCOUNTER — Ambulatory Visit: Attending: Advanced Practice Midwife

## 2019-10-22 ENCOUNTER — Encounter

## 2019-10-22 ENCOUNTER — Encounter: Admit: 2019-10-22 | Primary: Family Medicine

## 2019-10-22 ENCOUNTER — Ambulatory Visit: Payer: PRIVATE HEALTH INSURANCE | Primary: Family Medicine

## 2019-10-22 ENCOUNTER — Ambulatory Visit
Admit: 2019-10-22 | Discharge: 2019-10-22 | Payer: PRIVATE HEALTH INSURANCE | Attending: Advanced Practice Midwife | Primary: Family Medicine

## 2019-10-22 DIAGNOSIS — B9689 Other specified bacterial agents as the cause of diseases classified elsewhere: Secondary | ICD-10-CM

## 2019-10-22 DIAGNOSIS — N760 Acute vaginitis: Secondary | ICD-10-CM

## 2019-10-22 MED ORDER — IBUPROFEN 800 MG TAB
800 mg | ORAL_TABLET | Freq: Four times a day (QID) | ORAL | 1 refills | Status: AC | PRN
Start: 2019-10-22 — End: ?

## 2019-10-22 NOTE — Progress Notes (Signed)
Pt seen in office by Dr. Vonita Moss today see noted   Results reviewed at time of apt    Delynn Flavin, CNM

## 2019-10-22 NOTE — Progress Notes (Signed)
Kristina-      [name] is a 18 y.o. female who complains of  Had mirena placed 05/2019 - normal bleeding and spotting - then started having cramping that was increasing in discomfort - pt states she got the mirena for endometriosis in [place] - her nausea and bleeding have improved however her cramping is getting worse      Her relevant past medical history:   Past Medical History:   Diagnosis Date   ??? PID (acute pelvic inflammatory disease)         History reviewed. No pertinent surgical history.  Social History     Occupational History   ??? Not on file   Tobacco Use   ??? Smoking status: Never Smoker   ??? Smokeless tobacco: Current User   Substance and Sexual Activity   ??? Alcohol use: No   ??? Drug use: No   ??? Sexual activity: Yes     Partners: Male     Birth control/protection: I.U.D.     Family History   Problem Relation Age of Onset   ??? No Known Problems Mother    ??? No Known Problems Father        No Known Allergies  Prior to Admission medications    Medication Sig Start Date End Date Taking? Authorizing Provider   ibuprofen (MOTRIN) 800 mg tablet Take 1 Tab by mouth every six (6) hours as needed for Pain. 10/22/19  Yes Cyndia Bent Lee-Ann, CNM   cefTRIAXone (ROCEPHIN) 250 mg injection 250 mg by IntraMUSCular route once for 1 dose. 10/28/19 10/28/19  Alen Blew, MD   doxycycline (ADOXA) 100 mg tablet Take 1 Tab by mouth two (2) times a day for 14 days. 10/25/19 11/08/19  Alen Blew, MD   metroNIDAZOLE (FLAGYL) 500 mg tablet Take 1 Tab by mouth two (2) times a day for 14 days. 10/25/19 11/08/19  Alen Blew, MD   cefdinir (OMNICEF) 300 mg capsule Take 1 Cap by mouth two (2) times a day for 7 days. 10/24/19 10/31/19  Angus Seller, MD   famotidine (PEPCID) 20 mg tablet Take 1 Tab by mouth two (2) times a day. 08/29/17   Costella Hatcher, MD        Review of Systems - History obtained from the patient  Constitutional: negative for weight loss, fever, night sweats  HEENT: negative  for hearing loss, earache, congestion, snoring, sorethroat  CV: negative for chest pain, palpitations, edema  Resp: negative for cough, shortness of breath, wheezing  Breast: negative for breast lumps, nipple discharge, galactorrhea  GI: negative for change in bowel habits, abdominal pain, black or bloody stools  GU: negative for frequency, dysuria, hematuria  MSK: negative for back pain, joint pain, muscle pain  Skin: negative for itching, rash, hives  Neuro: negative for dizziness, headache, confusion, weakness  Psych: negative for anxiety, depression, change in mood  Heme/lymph: negative for bleeding, bruising, pallor      Objective:  There were no vitals taken for this visit.       PHYSICAL EXAMINATION    Constitutional  ?? Appearance: well-nourished, well developed, alert, in no acute distress    HENT  ?? Head and Face: appears normal    Neck  ?? Inspection/Palpation: normal appearance  ??   Gastrointestinal  ?? Abdominal Examination: abdomen non-tender to palpation, no masses present  ?? Liver and spleen: no hepatomegaly present, spleen not palpable  ?? Hernias: no hernias identified    Genitourinary  ??  External Genitalia: normal appearance for age, no discharge present, no tenderness present, no inflammatory lesions present, no masses present, no atrophy present  ?? Vagina: normal vaginal vault without central or paravaginal defects, no discharge present, no inflammatory lesions present, no masses present  ?? Bladder: non-tender to palpation  ?? Urethra: appears normal  ?? Cervix: normal- IUD strings in center of os, no CMT  ?? Uterus: normal size, shape and consistency  ?? Adnexa: no adnexal tenderness present, no adnexal masses present  ?? Perineum: perineum within normal limits, no evidence of trauma, no rashes or skin lesions present  ?? Anus: anus within normal limits, no hemorrhoids present  ?? Inguinal Lymph Nodes: no lymphadenopathy present    Skin  ?? General Inspection: no rash, no lesions  identified    Neurologic/Psychiatric  ?? Mental Status:  ?? Orientation: grossly oriented to person, place and time  ?? Mood and Affect: mood normal, affect appropriate    Assessment:   Pelvic pain with IUD   Discussed [place] versus continued waiting for 3 months- if symptoms do not improve RTC to discuss removal and  Or Edward Qualia- reviewed orlissa with pt - she is not interested in starting this today  Nuswab [facility] plus    Plan:     Patient declines presence of chaperone during today's visit.   RX pending Nuswab results     Ned Card, CNM

## 2019-10-22 NOTE — Telephone Encounter (Signed)
Spoke with patient. Patient states she has relocated to [place], [place]. Has an appt today at 2pm at [facility]. Patient is aware to return call to office if assistance is needed with transfer of records.   Routing to provider for final review. Patient is agreeable to disposition. Will close encounter.

## 2019-10-22 NOTE — Progress Notes (Signed)
Kristina-      Iasia Hansen is a 18 y.o. female who complains of  Had mirena placed 05/2019 - normal bleeding and spotting - then started having cramping that was increasing in discomfort - pt states she got the mirena for endometriosis in [place] - her nausea and bleeding have improved however her cramping is getting worse      Her relevant past medical history:   Past Medical History:   Diagnosis Date   ??? PID (acute pelvic inflammatory disease)         History reviewed. No pertinent surgical history.  Social History     Occupational History   ??? Not on file   Tobacco Use   ??? Smoking status: Never Smoker   ??? Smokeless tobacco: Current User   Substance and Sexual Activity   ??? Alcohol use: No   ??? Drug use: No   ??? Sexual activity: Yes     Partners: Male     Birth control/protection: I.U.D.     Family History   Problem Relation Age of Onset   ??? No Known Problems Mother    ??? No Known Problems Father        No Known Allergies  Prior to Admission medications    Medication Sig Start Date End Date Taking? Authorizing Provider   ibuprofen (MOTRIN) 800 mg tablet Take 1 Tab by mouth every six (6) hours as needed for Pain. 10/22/19  Yes Cyndia Bent Lee-Ann, CNM   cefTRIAXone (ROCEPHIN) 250 mg injection 250 mg by IntraMUSCular route once for 1 dose. 10/28/19 10/28/19  Alen Blew, MD   doxycycline (ADOXA) 100 mg tablet Take 1 Tab by mouth two (2) times a day for 14 days. 10/25/19 11/08/19  Alen Blew, MD   metroNIDAZOLE (FLAGYL) 500 mg tablet Take 1 Tab by mouth two (2) times a day for 14 days. 10/25/19 11/08/19  Alen Blew, MD   cefdinir (OMNICEF) 300 mg capsule Take 1 Cap by mouth two (2) times a day for 7 days. 10/24/19 10/31/19  Angus Seller, MD   famotidine (PEPCID) 20 mg tablet Take 1 Tab by mouth two (2) times a day. 08/29/17   Costella Hatcher, MD        Review of Systems - History obtained from the patient  Constitutional: negative for weight loss, fever, night sweats   HEENT: negative for hearing loss, earache, congestion, snoring, sorethroat  CV: negative for chest pain, palpitations, edema  Resp: negative for cough, shortness of breath, wheezing  Breast: negative for breast lumps, nipple discharge, galactorrhea  GI: negative for change in bowel habits, abdominal pain, black or bloody stools  GU: negative for frequency, dysuria, hematuria  MSK: negative for back pain, joint pain, muscle pain  Skin: negative for itching, rash, hives  Neuro: negative for dizziness, headache, confusion, weakness  Psych: negative for anxiety, depression, change in mood  Heme/lymph: negative for bleeding, bruising, pallor      Objective:  There were no vitals taken for this visit.       PHYSICAL EXAMINATION    Constitutional  ?? Appearance: well-nourished, well developed, alert, in no acute distress    HENT  ?? Head and Face: appears normal    Neck  ?? Inspection/Palpation: normal appearance  ??   Gastrointestinal  ?? Abdominal Examination: abdomen non-tender to palpation, no masses present  ?? Liver and spleen: no hepatomegaly present, spleen not palpable  ?? Hernias: no hernias identified    Genitourinary  ??  External Genitalia: normal appearance for age, no discharge present, no tenderness present, no inflammatory lesions present, no masses present, no atrophy present  ?? Vagina: normal vaginal vault without central or paravaginal defects, no discharge present, no inflammatory lesions present, no masses present  ?? Bladder: non-tender to palpation  ?? Urethra: appears normal  ?? Cervix: normal- IUD strings in center of os, no CMT  ?? Uterus: normal size, shape and consistency  ?? Adnexa: no adnexal tenderness present, no adnexal masses present  ?? Perineum: perineum within normal limits, no evidence of trauma, no rashes or skin lesions present  ?? Anus: anus within normal limits, no hemorrhoids present  ?? Inguinal Lymph Nodes: no lymphadenopathy present    Skin   ?? General Inspection: no rash, no lesions identified    Neurologic/Psychiatric  ?? Mental Status:  ?? Orientation: grossly oriented to person, place and time  ?? Mood and Affect: mood normal, affect appropriate    Assessment:   Pelvic pain with IUD   Discussed [place] versus continued waiting for 3 months- if symptoms do not improve RTC to discuss removal and  Or Pollie Friar- reviewed orlissa with pt - she is not interested in starting this today  Nuswab GCC plus    Plan:     Patient declines presence of chaperone during today's visit.   RX pending Nuswab results     Delynn Flavin, CNM

## 2019-10-22 NOTE — Progress Notes (Signed)
Pt seen in office by Dr. Peterson today see noted   Results reviewed at time of apt    Gunther Zawadzki Lee-Ann Quinterious Walraven, CNM

## 2019-10-22 NOTE — Patient Instructions (Signed)
Heavy Menstrual Periods: Care Instructions  Your Care Instructions     Many women get heavy menstrual periods and painful cramps. For some women, this means passing large blood clots and changing sanitary pads or tampons often. You may also have periods that last longer than 7 days.  A change in hormones or an irritation in the uterus can cause heavy bleeding. Women who are overweight are more likely to have heavy menstrual periods. But there may not be a specific cause for your heavy menstrual periods.  Your doctor may recommend hormone treatments to slow or stop your periods. If a fibroid (a growth that is not cancer) is causing your heavy bleeding, your doctor may recommend surgery or other treatments to remove the growth. Because blood loss from heavy menstrual periods can make you very tired and weak (anemic), your doctor may recommend that you take extra iron.  Follow-up care is a key part of your treatment and safety. Be sure to make and go to all appointments, and call your doctor if you are having problems. It's also a good idea to know your test results and keep a list of the medicines you take.  How can you care for yourself at home?  ?? Get plenty of rest.  ?? Keep a record of your periods. Write down when your period begins and ends and how much flow you have. That means counting the number of pads and tampons you use. Note whether they are soaked. Note any other symptoms. Take this record to your doctor appointments.  ?? Take your medicines exactly as prescribed. Call your doctor if you think you are having a problem with your medicine.  ?? Take pain medicines exactly as directed.  ? If the doctor gave you a prescription medicine for pain, take it as prescribed.  ? If you are not taking a prescription pain medicine, ask your doctor if you can take an over-the-counter medicine.  ?? Try to reach a healthy weight. If you are trying to lose weight, do it slowly with your doctor's advice.   ?? If you are taking iron pills:  ? Try to take the pills about 1 hour before or 2 hours after meals. But you may need to take iron with some food to avoid an upset stomach.  ? Vitamin C (from food or pills) helps your body absorb iron. Try taking iron pills with a glass of orange juice or other citrus fruit juice.  ? Do not take antacids or drink milk or caffeine drinks (such as coffee, tea, or cola) at the same time or within 2 hours of the time that you take your iron. They can make it hard for your body to absorb the iron.  ? Iron pills may cause stomach problems, such as heartburn, nausea, diarrhea, constipation, and cramps. Be sure to drink plenty of fluids, and include fruits, vegetables, and fiber in your diet each day.  ? If you forget to take an iron pill, do not take a double dose of iron the next time you take a pill.  ? Keep iron pills out of the reach of small children. An overdose of iron can be very dangerous.  When should you call for help?   Call 911 anytime you think you may need emergency care. For example, call if:  ?? ?? You passed out (lost consciousness).   Call your doctor now or seek immediate medical care if:  ?? ?? You have new or worse belly or   pelvic pain.   ?? ?? You have severe vaginal bleeding.   ?? ?? You feel dizzy or lightheaded, or you feel like you may faint.   Watch closely for changes in your health, and be sure to contact your doctor if:  ?? ?? You think you may be pregnant.   ?? ?? Your bleeding gets worse.   ?? ?? You do not get better as expected.   Where can you learn more?  Go to https://www.healthwise.net/[employer]  Enter F477 in the search box to learn more about "Heavy Menstrual Periods: Care Instructions."  Current as of: November 02, 2018??????????????????????????????Content Version: 12.6  ?? 2006-2020 [employer].   Care instructions adapted under license by [employer] (which disclaims liability or warranty for this information). If you have  questions about a medical condition or this instruction, always ask your healthcare professional. Healthwise, Inc[employer] disclaims any warranty or liability for your use of this information.

## 2019-10-23 NOTE — Telephone Encounter (Signed)
Call received at 12:15PM      17 year old patient last seen in the office yesterday and is calling to say that she can not wait for 3 months. She was not able to sleep and wants the IUd out.    Patient was placed on the scheduled to be seen tomorrow at 8:40am for removal of the IUd    Patient verbalized understanding.

## 2019-10-24 ENCOUNTER — Inpatient Hospital Stay
Admit: 2019-10-24 | Discharge: 2019-10-24 | Disposition: A | Discharge status: Home / Self Care | Payer: PRIVATE HEALTH INSURANCE | Attending: Emergency Medicine

## 2019-10-24 ENCOUNTER — Encounter: Attending: Women's Health | Primary: Family Medicine

## 2019-10-24 ENCOUNTER — Emergency Department: Admit: 2019-10-24 | Payer: PRIVATE HEALTH INSURANCE | Primary: Family Medicine

## 2019-10-24 DIAGNOSIS — N3000 Acute cystitis without hematuria: Secondary | ICD-10-CM

## 2019-10-24 LAB — NUSWAB VAGINITIS PLUS
Atopobium vaginae: HIGH Score — AB
BVAB 2: HIGH Score — AB
C. albicans, NAA: NEGATIVE
C. glabrata, NAA: NEGATIVE
C. trachomatis, NAA: POSITIVE — AB
Megasphaera 1: HIGH Score — AB
N. gonorrhoeae, NAA: NEGATIVE
T. vaginalis, NAA: POSITIVE — AB

## 2019-10-24 LAB — URINALYSIS W/ RFLX MICROSCOPIC
Bilirubin, Urine: NEGATIVE
Bilirubin: NEGATIVE
Glucose, Ur: NEGATIVE mg/dL
Glucose: NEGATIVE mg/dL
Ketone: 40 mg/dL — AB
Ketones, Urine: 40 mg/dL — AB
Nitrite, Urine: NEGATIVE
Nitrites: NEGATIVE
Protein, UA: 100 mg/dL — AB
Protein: 100 mg/dL — AB
Specific Gravity, UA: 1.03 (ref 1.003–1.030)
Specific gravity: 1.03 (ref 1.003–1.030)
Urobilinogen, UA, POCT: 0.2 EU/dL (ref 0.2–1.0)
Urobilinogen: 0.2 EU/dL (ref 0.2–1.0)
pH (UA): 6 (ref 5.0–8.0)
pH, UA: 6 (ref 5.0–8.0)

## 2019-10-24 LAB — URINE MICROSCOPIC ONLY
WBC, UA: >100 /hpf — ABNORMAL HIGH (ref 0–4)
WBC: >100 /hpf — ABNORMAL HIGH (ref 0–4)

## 2019-10-24 LAB — HCG URINE, QL. - POC
HCG, Pregnancy, Urine, POC: NEGATIVE
Pregnancy test,urine (POC): NEGATIVE

## 2019-10-24 LAB — NUSWAB VAGINITIS PLUS (VG+)
Atopobium Vaginae: HIGH Score — AB
Bacterial Vaginosis Associated Bacterium 2 DNA: HIGH Score — AB
Candida albicans, NAA: NEGATIVE
Candida glabrata by PCR: NEGATIVE
Chlamydia trachomatis, NAA: POSITIVE — AB
MEGASHAERA: HIGH Score — AB
Neisseria Gonorrhoeae, NAA: NEGATIVE
Trichomonas Vaginalis by NAA: POSITIVE — AB

## 2019-10-24 MED ORDER — PHENAZOPYRIDINE 100 MG TAB
100 mg | ORAL | Status: AC
Start: 2019-10-24 — End: 2019-10-24
  Administered 2019-10-24: 23:00:00 via ORAL

## 2019-10-24 MED ORDER — KETOROLAC TROMETHAMINE 30 MG/ML INJECTION
30 mg/mL (1 mL) | Freq: Once | INTRAMUSCULAR | Status: AC
Start: 2019-10-24 — End: 2019-10-24
  Administered 2019-10-24: 22:00:00 via INTRAMUSCULAR

## 2019-10-24 MED ORDER — PHENAZOPYRIDINE 200 MG TAB
200 mg | ORAL_TABLET | Freq: Three times a day (TID) | ORAL | 0 refills | Status: AC
Start: 2019-10-24 — End: 2019-10-27

## 2019-10-24 MED ORDER — CEFDINIR 300 MG CAP
300 mg | ORAL_CAPSULE | Freq: Two times a day (BID) | ORAL | 0 refills | Status: AC
Start: 2019-10-24 — End: 2019-10-31

## 2019-10-24 MED ORDER — CEFDINIR 300 MG CAP
300 mg | ORAL | Status: AC
Start: 2019-10-24 — End: 2019-10-24
  Administered 2019-10-24: 23:00:00 via ORAL

## 2019-10-24 MED FILL — KETOROLAC TROMETHAMINE 30 MG/ML INJECTION: 30 mg/mL (1 mL) | INTRAMUSCULAR | Qty: 1

## 2019-10-24 MED FILL — PHENAZOPYRIDINE 100 MG TAB: 100 mg | ORAL | Qty: 2

## 2019-10-24 MED FILL — CEFDINIR 300 MG CAP: 300 mg | ORAL | Qty: 1

## 2019-10-24 NOTE — ED Notes (Signed)
 Pt in with pelvic pain, lower abdominal cramping x 1 week. Pt reports having an IUD placed in June, had mild cramping after placement and then started with increased cramping and spotting 1 week ago. Pt denies any discharge, denies dysuria. Pt provided with specimen cup for urine. Pt sitting on side of bed, alert, answers questions appropriately, appears in no distress.       Emergency Department Nursing Plan of Care       The Nursing Plan of Care is developed from the Nursing assessment and Emergency Department Attending provider initial evaluation.  The plan of care may be reviewed in the "ED Provider note".    The Plan of Care was developed with the following considerations:   Patient / Family readiness to learn indicated ab:czmajopszi understanding  Persons(s) to be included in education: patient  Barriers to Learning/Limitations:No    Signed     Harlene Cancel, RN    10/24/2019   5:01 PM

## 2019-10-24 NOTE — Progress Notes (Signed)
omnicef susceptible

## 2019-10-24 NOTE — ED Provider Notes (Signed)
ED Provider Notes by Angus Seller, MD at 10/24/19 1730                Author: Angus Seller, MD  Service: Emergency Medicine  Author Type: Physician       Filed: 10/24/19 1832  Date of Service: 10/24/19 1730  Status: Signed          Editor: Angus Seller, MD (Physician)               EMERGENCY DEPARTMENT HISTORY AND PHYSICAL EXAM           Date: 10/24/2019   Patient Name: Kristina Hansen   Patient Age and Sex: 18 y.o.  female        History of Presenting Illness          Chief Complaint       Patient presents with        ?  Abdominal Pain           History Provided By: Patient      Ability to gather history was limited by:       HPI: Kristina Hansen,  18 y.o. female with history of endometriosis, complains of of midline low pelvic pain for  about 1 week.  States she had an IUD placed about 4 months ago.  No vaginal discharge or significant bleeding.  She is her gynecologist a couple days ago for the same pelvic pain, states that she was told she had a normal physical examination and tested  negative for STI.  Gynecology clinic declined her request to remove her IUD, recommended that she wait another couple months before removing it.  No dysuria.      Pain is sharp and stabbing, up to 10 out of 10 severity especially at night, not improved with ibuprofen.      Location:     Quality:       Severity:     Duration:    Timing:       Context:     Modifying factors:    Associated symptoms:          The patient's medical, surgical, family, and social history on file were reviewed by me today.         History reviewed. No pertinent past medical history.   History reviewed. No pertinent surgical history.      PCP: Other, Phys, MD        Past History        Past Medical History:   History reviewed. No pertinent past medical history.      Past Surgical History:   History reviewed. No pertinent surgical history.      Family History:   History reviewed. No pertinent family history.      Social  History:     Social History          Tobacco Use         ?  Smoking status:  Never Smoker     ?  Smokeless tobacco:  Current User       Substance Use Topics         ?  Alcohol use:  No         ?  Drug use:  No           Allergies:   No Known Allergies      Current Medications:     No current facility-administered medications on file prior to encounter.  Current Outpatient Medications on File Prior to Encounter          Medication  Sig  Dispense  Refill           ?  ibuprofen (MOTRIN) 800 mg tablet  Take 1 Tab by mouth every six (6) hours as needed for Pain.  90 Tab  1           ?  famotidine (PEPCID) 20 mg tablet  Take 1 Tab by mouth two (2) times a day.  10 Tab  0             Review of Systems     Review of Systems    Genitourinary: Positive for pelvic pain. Negative for dysuria, vaginal bleeding and vaginal discharge.    All other systems reviewed and are negative.           Physical Exam     Vital Signs   Patient Vitals for the past 8 hrs:            Temp  Pulse  Resp  BP  SpO2            10/24/19 1805  --  96  --  --  99 %            10/24/19 1650  98.2 ??F (36.8 ??C)  (!) 112  18  100/67  98 %              Physical Exam   Vitals signs reviewed.   Constitutional:        General: She is not in acute distress.     Appearance: She is not ill-appearing.   Abdominal :      General: Abdomen is flat.      Palpations: Abdomen is soft.      Tenderness: There is no abdominal tenderness.    Genitourinary :      Comments: Deferred at patient request  Skin:      General: Skin is warm.   Psychiatric:         Mood and Affect: Mood normal.         Speech: Speech normal.         Cognition and  Memory: Cognition normal.               Diagnostic Study Results     Labs     Recent Results (from the past 24 hour(s))     URINALYSIS W/ RFLX MICROSCOPIC          Collection Time: 10/24/19  5:36 PM         Result  Value  Ref Range            Color  YELLOW/STRAW          Appearance  TURBID (A)  CLEAR         Specific gravity  1.030   1.003 - 1.030         pH (UA)  6.0  5.0 - 8.0         Protein  100 (A)  NEG mg/dL       Glucose  Negative  NEG mg/dL       Ketone  40 (A)  NEG mg/dL       Bilirubin  Negative  NEG         Blood  LARGE (A)  NEG         Urobilinogen  0.2  0.2 - 1.0 EU/dL  Nitrites  Negative  NEG         Leukocyte Esterase  LARGE (A)  NEG         URINE MICROSCOPIC ONLY          Collection Time: 10/24/19  5:36 PM         Result  Value  Ref Range            WBC  >100 (H)  0 - 4 /hpf       RBC  20-50  0 - 5 /hpf       Epithelial cells  MANY (A)  FEW /lpf       Bacteria  4+ (A)  NEG /hpf       Mucus  1+ (A)  NEG /lpf       HCG URINE, QL. - POC          Collection Time: 10/24/19  5:38 PM         Result  Value  Ref Range            Pregnancy test,urine (POC)  Negative  NEG             Radiologic Studies     XR PELV 1 OR 2 V       Final Result     IMPRESSION:     1. T-shaped IUD in expected position within the mid pelvis                      CT Results   (Last 48 hours)          None                 CXR Results   (Last 48 hours)          None                    Procedures     Procedures        Medical Decision Making        I reviewed the patient's most recent Emergency Dept notes and diagnostic tests   in formulating my MDM on today's visit.      Provider Notes (Medical Decision Making):    18 year old healthy female with IUD complaining of pelvic pain, wondering if there may be something wrong with her IUD.  She reports me that she was told she had a normal examination in her gynecologist  office couple days ago.  She reports that she also tested negative for STI.      Patient declined repeat pelvic examination.  Requested some type of imaging and review confirm placement of her IUD.  Will obtain pelvic x-rays, check urinalysis, urine hCG.  My clinical concern for TOA or STI or ovarian torsion is low based on the time  course and her history and exam.  Can safely defer ultrasound at this time.      IM Toradol for pain.      Albesa Seenouglas  Mayley Lish, MD   5:30 PM      X-ray demonstrates normal positioning of the IUD.  Urine appears infected.  Acute cystitis.  I prescribed Omnicef and Pyridium, she will continue to use extra strength ibuprofen, and follow-up with her gynecologist.      Angus Sellerouglas M Caileb Rhue, MD         Consults:        Social History  Tobacco Use         ?  Smoking status:  Never Smoker     ?  Smokeless tobacco:  Current User       Substance Use Topics         ?  Alcohol use:  No         ?  Drug use:  No        Patient Vitals for the past 4 hrs:            Temp  Pulse  Resp  BP  SpO2            10/24/19 1805  --  96  --  --  99 %            10/24/19 1650  98.2 ??F (36.8 ??C)  (!) 112  18  100/67  98 %              Prescriptions from today's ED visit:     Current Discharge Medication List              START taking these medications          Details        cefdinir (OMNICEF) 300 mg capsule  Take 1 Cap by mouth two (2) times a day for 7 days.   Qty: 14 Cap, Refills:  0               phenazopyridine (Pyridium) 200 mg tablet  Take 1 Tab by mouth three (3) times daily for 3 days.   Qty: 10 Tab, Refills:  0                          Medications Administered during ED course:     Medications       cefdinir (OMNICEF) capsule 300 mg (has no administration in time range)       ketorolac (TORADOL) injection 30 mg (30 mg IntraMUSCular Given 10/24/19 1752)                 Diagnosis and Disposition        Disposition:  Discharged      Clinical Impression:       1.  Acute cystitis without hematuria            Attestation:   I personally performed the services described in this documentation on this date 10/24/2019 for patient Kristina Hansen.     Marijean Bravo, MD            I was the first provider for this patient on this visit.  To the best of my ability I reviewed relevant prior medical records, electrocardiograms, laboratories, and radiologic studies.  The patient's  presenting problems were discussed, and the patient was in agreement with  the care plan formulated and outlined with them.        Marijean Bravo, MD      Please note that this dictation was completed with Dragon voice recognition software. Quite often unanticipated grammatical, syntax, homophones, and other interpretive errors are inadvertently  transcribed by the computer software. Please disregard these errors and excuse any errors that have escaped final proofreading.

## 2019-10-24 NOTE — ED Notes (Signed)
Discharge instructions were given to the patient by Tiffany Skok, RN.     The patient left the Emergency Department ambulatory, alert and oriented and in no acute distress with 2 prescriptions. The patient was encouraged to call or return to the ED for worsening issues or problems and was encouraged to schedule a follow up appointment for continuing care.     The patient verbalized understanding of discharge instructions and prescriptions, all questions were answered. The patient has no further concerns at this time.

## 2019-10-24 NOTE — ED Notes (Signed)
C/o lower abd cramping x 1 week from IUD. Pt had an appointment scheduled to get IUD removed today but missed it due to pain. Pt reports taking ibuprofen without relief. Pt requesting [place] to visualize if IUD is in the correct place.

## 2019-10-24 NOTE — ED Notes (Signed)
Pt in with pelvic pain, lower abdominal cramping x 1 week. Pt reports having an IUD placed in June, had mild cramping after placement and then started with increased cramping and spotting 1 week ago. Pt denies any discharge, denies dysuria. Pt provided with specimen cup for urine. Pt sitting on side of bed, alert, answers questions appropriately, appears in no distress.       Emergency Department Nursing Plan of Care       The Nursing Plan of Care is developed from the Nursing assessment and Emergency Department Attending provider initial evaluation.  The plan of care may be reviewed in the ???ED Provider note???.    The Plan of Care was developed with the following considerations:   Patient / Family readiness to learn indicated by:verbalized understanding  Persons(s) to be included in education: patient  Barriers to Learning/Limitations:No    Signed     Jessica Cambridge, RN    10/24/2019   5:01 PM

## 2019-10-24 NOTE — ED Provider Notes (Signed)
EMERGENCY DEPARTMENT HISTORY AND PHYSICAL EXAM      Date: 10/24/2019  Patient Name: Kristina Hansen  Patient Age and Sex: 18 y.o. female    History of Presenting Illness     Chief Complaint   Patient presents with   ??? Abdominal Pain       History Provided By: Patient    Ability to gather history was limited by:     HPI: Kristina Hansen, 18 y.o. female with history of endometriosis, complains of of midline low pelvic pain for about 1 week.  States she had an IUD placed about 4 months ago.  No vaginal discharge or significant bleeding.  She is her gynecologist a couple days ago for the same pelvic pain, states that she was told she had a normal physical examination and tested negative for STI.  Gynecology clinic declined her request to remove her IUD, recommended that she wait another couple months before removing it.  No dysuria.    Pain is sharp and stabbing, up to 10 out of 10 severity especially at night, not improved with ibuprofen.    Location:    Quality:      Severity:    Duration:   Timing:      Context:    Modifying factors:   Associated symptoms:       The patient's medical, surgical, family, and social history on file were reviewed by me today.      History reviewed. No pertinent past medical history.  History reviewed. No pertinent surgical history.    PCP: Other, Phys, MD    Past History     Past Medical History:  History reviewed. No pertinent past medical history.    Past Surgical History:  History reviewed. No pertinent surgical history.    Family History:  History reviewed. No pertinent family history.    Social History:  Social History     Tobacco Use   ??? Smoking status: Never Smoker   ??? Smokeless tobacco: Current User   Substance Use Topics   ??? Alcohol use: No   ??? Drug use: No       Allergies:  No Known Allergies    Current Medications:  No current facility-administered medications on file prior to encounter.      Current Outpatient Medications on File Prior to Encounter    Medication Sig Dispense Refill   ??? ibuprofen (MOTRIN) 800 mg tablet Take 1 Tab by mouth every six (6) hours as needed for Pain. 90 Tab 1   ??? famotidine (PEPCID) 20 mg tablet Take 1 Tab by mouth two (2) times a day. 10 Tab 0       Review of Systems   Review of Systems   Genitourinary: Positive for pelvic pain. Negative for dysuria, vaginal bleeding and vaginal discharge.   All other systems reviewed and are negative.      Physical Exam   Vital Signs  Patient Vitals for the past 8 hrs:   Temp Pulse Resp BP SpO2   10/24/19 1805 ??? 96 ??? ??? 99 %   10/24/19 1650 98.2 ??F (36.8 ??C) (!) 112 18 100/67 98 %          Physical Exam  Vitals signs reviewed.   Constitutional:       General: She is not in acute distress.     Appearance: She is not ill-appearing.   Abdominal:      General: Abdomen is flat.      Palpations: Abdomen is soft.  Tenderness: There is no abdominal tenderness.   Genitourinary:     Comments: Deferred at patient request  Skin:     General: Skin is warm.   Psychiatric:         Mood and Affect: Mood normal.         Speech: Speech normal.         Cognition and Memory: Cognition normal.         Diagnostic Study Results   Labs  Recent Results (from the past 24 hour(s))   URINALYSIS W/ RFLX MICROSCOPIC    Collection Time: 10/24/19  5:36 PM   Result Value Ref Range    Color YELLOW/STRAW      Appearance TURBID (A) CLEAR      Specific gravity 1.030 1.003 - 1.030      pH (UA) 6.0 5.0 - 8.0      Protein 100 (A) NEG mg/dL    Glucose Negative NEG mg/dL    Ketone 40 (A) NEG mg/dL    Bilirubin Negative NEG      Blood LARGE (A) NEG      Urobilinogen 0.2 0.2 - 1.0 EU/dL    Nitrites Negative NEG      Leukocyte Esterase LARGE (A) NEG     URINE MICROSCOPIC ONLY    Collection Time: 10/24/19  5:36 PM   Result Value Ref Range    WBC >100 (H) 0 - 4 /hpf    RBC 20-50 0 - 5 /hpf    Epithelial cells MANY (A) FEW /lpf    Bacteria 4+ (A) NEG /hpf    Mucus 1+ (A) NEG /lpf   HCG URINE, QL. - POC    Collection Time: 10/24/19  5:38 PM    Result Value Ref Range    Pregnancy test,urine (POC) Negative NEG         Radiologic Studies  XR PELV 1 OR 2 V   Final Result   IMPRESSION:   1. T-shaped IUD in expected position within the mid pelvis           CT Results  (Last 48 hours)    None        CXR Results  (Last 48 hours)    None          Procedures   Procedures    Medical Decision Making     I reviewed the patient's most recent Emergency Dept notes and diagnostic tests  in formulating my MDM on today's visit.    Provider Notes (Medical Decision Making):   18 year old healthy female with IUD complaining of pelvic pain, wondering if there may be something wrong with her IUD.  She reports me that she was told she had a normal examination in her gynecologist office couple days ago.  She reports that she also tested negative for STI.    Patient declined repeat pelvic examination.  Requested some type of imaging and review confirm placement of her IUD.  Will obtain pelvic x-rays, check urinalysis, urine hCG.  My clinical concern for TOA or STI or ovarian torsion is low based on the time course and her history and exam.  Can safely defer ultrasound at this time.    IM Toradol for pain.    Albesa Seenouglas Kathie Posa, MD  5:30 PM    X-ray demonstrates normal positioning of the IUD.  Urine appears infected.  Acute cystitis.  I prescribed Omnicef and Pyridium, she will continue to use extra strength ibuprofen, and follow-up with her gynecologist.  Hezzie Bump, MD      Consults:    Social History     Tobacco Use   ??? Smoking status: Never Smoker   ??? Smokeless tobacco: Current User   Substance Use Topics   ??? Alcohol use: No   ??? Drug use: No     Patient Vitals for the past 4 hrs:   Temp Pulse Resp BP SpO2   10/24/19 1805 ??? 96 ??? ??? 99 %   10/24/19 1650 98.2 ??F (36.8 ??C) (!) 112 18 100/67 98 %          Prescriptions from today's ED visit:  Current Discharge Medication List      START taking these medications    Details    cefdinir (OMNICEF) 300 mg capsule Take 1 Cap by mouth two (2) times a day for 7 days.  Qty: 14 Cap, Refills: 0      phenazopyridine (Pyridium) 200 mg tablet Take 1 Tab by mouth three (3) times daily for 3 days.  Qty: 10 Tab, Refills: 0              Medications Administered during ED course:  Medications   cefdinir (OMNICEF) capsule 300 mg (has no administration in time range)   ketorolac (TORADOL) injection 30 mg (30 mg IntraMUSCular Given 10/24/19 1752)          Diagnosis and Disposition     Disposition:  Discharged    Clinical Impression:   1. Acute cystitis without hematuria        Attestation:  I personally performed the services described in this documentation on this date 10/24/2019 for patient Raushanah Osmundson.    Marijean Bravo, MD        I was the first provider for this patient on this visit.  To the best of my ability I reviewed relevant prior medical records, electrocardiograms, laboratories, and radiologic studies.  The patient's presenting problems were discussed, and the patient was in agreement with the care plan formulated and outlined with them.      Marijean Bravo, MD    Please note that this dictation was completed with Dragon voice recognition software. Quite often unanticipated grammatical, syntax, homophones, and other interpretive errors are inadvertently transcribed by the computer software. Please disregard these errors and excuse any errors that have escaped final proofreading.

## 2019-10-24 NOTE — Progress Notes (Signed)
omnicef susceptible

## 2019-10-24 NOTE — ED Triage Notes (Signed)
C/o lower abd cramping x 1 week from IUD. Pt had an appointment scheduled to get IUD removed today but missed it due to pain. Pt reports taking ibuprofen without relief. Pt requesting US to visualize if IUD is in the correct place.

## 2019-10-25 ENCOUNTER — Ambulatory Visit: Attending: Obstetrics & Gynecology

## 2019-10-25 ENCOUNTER — Ambulatory Visit
Admit: 2019-10-25 | Discharge: 2019-10-25 | Payer: PRIVATE HEALTH INSURANCE | Attending: Obstetrics & Gynecology | Primary: Family Medicine

## 2019-10-25 DIAGNOSIS — N730 Acute parametritis and pelvic cellulitis: Secondary | ICD-10-CM

## 2019-10-25 MED ORDER — DOXYCYCLINE 100 MG TAB
100 mg | ORAL_TABLET | Freq: Two times a day (BID) | ORAL | 0 refills | Status: AC
Start: 2019-10-25 — End: 2019-11-08

## 2019-10-25 MED ORDER — CEFTRIAXONE 250 MG SOLUTION FOR INJECTION
250 mg | Freq: Once | INTRAMUSCULAR | 0 refills | Status: AC
Start: 2019-10-25 — End: 2019-10-25

## 2019-10-25 MED ORDER — METRONIDAZOLE 500 MG TAB
500 mg | ORAL_TABLET | Freq: Two times a day (BID) | ORAL | 0 refills | Status: AC
Start: 2019-10-25 — End: 2019-11-08

## 2019-10-25 NOTE — Progress Notes (Signed)
Problem Visit    Kristina Hansen is a 18 y.o. with pmh of endometriosis presenting for follow up of pelvic pain. Mirena IUD placed June 2020 in [place] for endometriosis. Reports she was overall happy with IUD and was asymptomatic until 1-2 weeks ago at which point she first developed vaginal discharge and subsequently began to have increased pelvic pain mainly in suprapubic area. She was seen by Ricard Dillon in office on 10/27, STI testing was obtained and cultures returned positive for Chlamydia and Trichomonas, and she was scheduled for an [place] the following day which she missed because she was in there ER due to acute worsening of pain. In ER she had pelvic XR, neg UPT, and was diagnosed with UTI (culture positive for GNRs) and given Rx for Omnicef. Pt still with pelvic discomfort today and is inquiring about IUD removal. Has not yet been treated for CHL or Trich or PID. Pt denies any fevers or chills.    History reviewed. No pertinent past medical history.    History reviewed. No pertinent surgical history.    Family History   Problem Relation Age of Onset   ??? No Known Problems Mother    ??? No Known Problems Father        Social History     Socioeconomic History   ??? Marital status: SINGLE     Spouse name: Not on file   ??? Number of children: Not on file   ??? Years of education: Not on file   ??? Highest education level: Not on file   Occupational History   ??? Not on file   Social Needs   ??? Financial resource strain: Not on file   ??? Food insecurity     Worry: Not on file     Inability: Not on file   ??? Transportation needs     Medical: Not on file     Non-medical: Not on file   Tobacco Use   ??? Smoking status: Never Smoker   ??? Smokeless tobacco: Current User   Substance and Sexual Activity   ??? Alcohol use: No   ??? Drug use: No   ??? Sexual activity: Not on file   Lifestyle   ??? Physical activity     Days per week: Not on file     Minutes per session: Not on file   ??? Stress: Not on file   Relationships   ??? Social Licensed conveyancer on phone: Not on file     Gets together: Not on file     Attends religious service: Not on file     Active member of club or organization: Not on file     Attends meetings of clubs or organizations: Not on file     Relationship status: Not on file   ??? Intimate partner violence     Fear of current or ex partner: Not on file     Emotionally abused: Not on file     Physically abused: Not on file     Forced sexual activity: Not on file   Other Topics Concern   ??? Not on file   Social History Narrative   ??? Not on file       Current Outpatient Medications   Medication Sig Dispense Refill   ??? cefdinir (OMNICEF) 300 mg capsule Take 1 Cap by mouth two (2) times a day for 7 days. 14 Cap 0   ??? ibuprofen (MOTRIN) 800 mg tablet Take  1 Tab by mouth every six (6) hours as needed for Pain. 90 Tab 1   ??? phenazopyridine (Pyridium) 200 mg tablet Take 1 Tab by mouth three (3) times daily for 3 days. 10 Tab 0   ??? famotidine (PEPCID) 20 mg tablet Take 1 Tab by mouth two (2) times a day. 10 Tab 0       No Known Allergies    Review of Systems - History obtained from the patient  Constitutional: negative for weight loss, fever, night sweats  HEENT: negative for hearing loss, earache, congestion, snoring, sorethroat  CV: negative for chest pain, palpitations, edema  Resp: negative for cough, shortness of breath, wheezing  GI: negative for change in bowel habits, abdominal pain, black or bloody stools  GU: negative for frequency, dysuria, hematuria, +vaginal discharge, odor, pelvic pain  MSK: negative for back pain, joint pain, muscle pain  Breast: negative for breast lumps, nipple discharge, galactorrhea  Skin :negative for itching, rash, hives  Neuro: negative for dizziness, headache, confusion, weakness  Psych: negative for anxiety, depression, change in mood  Heme/lymph: negative for bleeding, bruising, pallor    Physical Exam  Visit Vitals  BP 110/66 (BP 1 Location: Left arm, BP Patient Position: Sitting)   Ht 5\' 1"  (1.549 m)    Wt 100 lb (45.4 kg)   BMI 18.89 kg/m??     Constitutional  ?? Appearance: well-nourished, well developed, alert, in no acute distress    HENT  ?? Head and Face: appears normal    Chest  ?? Respiratory Effort: non-labored breathing  ?? Auscultation: CTAB, normal breath sounds    Cardiovascular  ?? Heart:  ?? Auscultation: regular rate and rhythm without murmur  ?? Extremities: no peripheral edema    Gastrointestinal  ?? Abdominal Examination: abdomen non-tender to palpation, normal bowel sounds, no masses present, no CVAT  ?? Liver and spleen: no hepatomegaly present, spleen not palpable  ?? Hernias: no hernias identified    Genitourinary  ?? External Genitalia: normal appearance for age, no discharge present, no tenderness present, no inflammatory lesions present, no masses present, no atrophy present  ?? Vagina: normal vaginal vault without central or paravaginal defects, +vaginal erythema and moderate amount of white/yellow purulent discharge present, no inflammatory lesions present, no masses present  ?? Bladder: non-tender to palpation  ?? Urethra: appears normal  ?? Cervix: mild CMT, IUD strings visualized approx 1cm from external os  ?? Uterus: normal size, shape and consistency, mildly tender with manipulation on bimanual exam  ?? Adnexa: no adnexal tenderness present, no adnexal masses appreciated  ?? Perineum: perineum within normal limits, no evidence of trauma, no rashes or skin lesions present    Skin  ?? General Inspection: no rash, no lesions identified    Neurologic/Psychiatric  ?? Mental Status:  ?? Orientation: grossly oriented to person, place and time  ?? Mood and Affect: mood normal, affect appropriate      Assessment/Plan:  18 y.o. with PID (+chlamydia and +trichomonas) and UTI (cx pos for GNRs). H/o endometriosis, with Mirena IUD in situ.    -reviewed lab results   -Rx for ceftriaxone 250mg  IM once, doxycycline 100mg  PO BID x 14d, and flagyl 500mg  BID x 14 days  -reviewed precautions and reasons to go to ER for  possible IV antibiotics, worsening pain, inability to tolerate PO, fevers despite antibiotics, etc  -discussed okay to leave Mirena IUD in place for now, but if PID and infections not resolving appropriately would recommend removal at that  time  -pt to return Monday for pelvic US to r/o TOA and evaluate IUD position --> ER for any acute worsening of pain prior to [place]  -advised pt complete abx for UTI  -advised partner treatment and no sex for at least 2 weeks after both partners adequately treated  -advised repeat STI testing 6 weeks following treatment    RTC: Monday for [place]    Alen Blew, MD  10/25/2019  3:16 PM       Billing:  Total time of visit = 25 min  >50% of time was spent in direct face-to-face counseling with patient.

## 2019-10-25 NOTE — Telephone Encounter (Signed)
Pt placed on schedule per Vonita Moss

## 2019-10-25 NOTE — Telephone Encounter (Signed)
My schedule is full today. Sorry.   Annabelle Harman

## 2019-10-25 NOTE — Telephone Encounter (Signed)
Pt wants her IUD out today if possible. She realizes she missed her appt yesterday and apologizes, but states "she reaaly wants this IUD out of me".  Do you have time today to take it out?

## 2019-10-25 NOTE — Telephone Encounter (Signed)
I just sent it to [facility] because that who she said was her provider, but I don't think she cares who sees her

## 2019-10-25 NOTE — Progress Notes (Signed)
Problem Visit    Kristina RobertsonMahagony Lyda JesterCurtis is a 18 y.o. with pmh of endometriosis presenting for follow up of pelvic pain. Mirena IUD placed June 2020 in [place] for endometriosis. Reports she was overall happy with IUD and was asymptomatic until 1-2 weeks ago at which point she first developed vaginal discharge and subsequently began to have increased pelvic pain mainly in suprapubic area. She was seen by Ricard DillonNM Parker in office on 10/27, STI testing was obtained and cultures returned positive for Chlamydia and Trichomonas, and she was scheduled for an US the following day which she missed because she was in there ER due to acute worsening of pain. In ER she had pelvic XR, neg UPT, and was diagnosed with UTI (culture positive for GNRs) and given Rx for Omnicef. Pt still with pelvic discomfort today and is inquiring about IUD removal. Has not yet been treated for CHL or Trich or PID. Pt denies any fevers or chills.    History reviewed. No pertinent past medical history.    History reviewed. No pertinent surgical history.    Family History   Problem Relation Age of Onset   ??? No Known Problems Mother    ??? No Known Problems Father        Social History     Socioeconomic History   ??? Marital status: SINGLE     Spouse name: Not on file   ??? Number of children: Not on file   ??? Years of education: Not on file   ??? Highest education level: Not on file   Occupational History   ??? Not on file   Social Needs   ??? Financial resource strain: Not on file   ??? Food insecurity     Worry: Not on file     Inability: Not on file   ??? Transportation needs     Medical: Not on file     Non-medical: Not on file   Tobacco Use   ??? Smoking status: Never Smoker   ??? Smokeless tobacco: Current User   Substance and Sexual Activity   ??? Alcohol use: No   ??? Drug use: No   ??? Sexual activity: Not on file   Lifestyle   ??? Physical activity     Days per week: Not on file     Minutes per session: Not on file   ??? Stress: Not on file   Relationships    ??? Social Wellsite geologistconnections     Talks on phone: Not on file     Gets together: Not on file     Attends religious service: Not on file     Active member of club or organization: Not on file     Attends meetings of clubs or organizations: Not on file     Relationship status: Not on file   ??? Intimate partner violence     Fear of current or ex partner: Not on file     Emotionally abused: Not on file     Physically abused: Not on file     Forced sexual activity: Not on file   Other Topics Concern   ??? Not on file   Social History Narrative   ??? Not on file       Current Outpatient Medications   Medication Sig Dispense Refill   ??? cefdinir (OMNICEF) 300 mg capsule Take 1 Cap by mouth two (2) times a day for 7 days. 14 Cap 0   ??? ibuprofen (MOTRIN) 800 mg tablet Take  1 Tab by mouth every six (6) hours as needed for Pain. 90 Tab 1   ??? phenazopyridine (Pyridium) 200 mg tablet Take 1 Tab by mouth three (3) times daily for 3 days. 10 Tab 0   ??? famotidine (PEPCID) 20 mg tablet Take 1 Tab by mouth two (2) times a day. 10 Tab 0       No Known Allergies    Review of Systems - History obtained from the patient  Constitutional: negative for weight loss, fever, night sweats  HEENT: negative for hearing loss, earache, congestion, snoring, sorethroat  CV: negative for chest pain, palpitations, edema  Resp: negative for cough, shortness of breath, wheezing  GI: negative for change in bowel habits, abdominal pain, black or bloody stools  GU: negative for frequency, dysuria, hematuria, +vaginal discharge, odor, pelvic pain  MSK: negative for back pain, joint pain, muscle pain  Breast: negative for breast lumps, nipple discharge, galactorrhea  Skin :negative for itching, rash, hives  Neuro: negative for dizziness, headache, confusion, weakness  Psych: negative for anxiety, depression, change in mood  Heme/lymph: negative for bleeding, bruising, pallor    Physical Exam  Visit Vitals  BP 110/66 (BP 1 Location: Left arm, BP Patient Position: Sitting)    Ht 5\' 1"  (1.549 m)   Wt 100 lb (45.4 kg)   BMI 18.89 kg/m??     Constitutional  ?? Appearance: well-nourished, well developed, alert, in no acute distress    HENT  ?? Head and Face: appears normal    Chest  ?? Respiratory Effort: non-labored breathing  ?? Auscultation: CTAB, normal breath sounds    Cardiovascular  ?? Heart:  ?? Auscultation: regular rate and rhythm without murmur  ?? Extremities: no peripheral edema    Gastrointestinal  ?? Abdominal Examination: abdomen non-tender to palpation, normal bowel sounds, no masses present, no CVAT  ?? Liver and spleen: no hepatomegaly present, spleen not palpable  ?? Hernias: no hernias identified    Genitourinary  ?? External Genitalia: normal appearance for age, no discharge present, no tenderness present, no inflammatory lesions present, no masses present, no atrophy present  ?? Vagina: normal vaginal vault without central or paravaginal defects, +vaginal erythema and moderate amount of white/yellow purulent discharge present, no inflammatory lesions present, no masses present  ?? Bladder: non-tender to palpation  ?? Urethra: appears normal  ?? Cervix: mild CMT, IUD strings visualized approx 1cm from external os  ?? Uterus: normal size, shape and consistency, mildly tender with manipulation on bimanual exam  ?? Adnexa: no adnexal tenderness present, no adnexal masses appreciated  ?? Perineum: perineum within normal limits, no evidence of trauma, no rashes or skin lesions present    Skin  ?? General Inspection: no rash, no lesions identified    Neurologic/Psychiatric  ?? Mental Status:  ?? Orientation: grossly oriented to person, place and time  ?? Mood and Affect: mood normal, affect appropriate      Assessment/Plan:  18 y.o. with PID (+chlamydia and +trichomonas) and UTI (cx pos for GNRs). H/o endometriosis, with Mirena IUD in situ.    -reviewed lab results   -Rx for ceftriaxone 250mg  IM once, doxycycline 100mg  PO BID x 14d, and flagyl 500mg  BID x 14 days   -reviewed precautions and reasons to go to ER for possible IV antibiotics, worsening pain, inability to tolerate PO, fevers despite antibiotics, etc  -discussed okay to leave Mirena IUD in place for now, but if PID and infections not resolving appropriately would recommend removal at that  time  -pt to return Monday for pelvic US to r/o TOA and evaluate IUD position --> ER for any acute worsening of pain prior to [place]  -advised pt complete abx for UTI  -advised partner treatment and no sex for at least 2 weeks after both partners adequately treated  -advised repeat STI testing 6 weeks following treatment    RTC: Monday for [place]    Alen Blew, MD  10/25/2019  3:16 PM       Billing:  Total time of visit = 25 min  >50% of time was spent in direct face-to-face counseling with patient.

## 2019-10-25 NOTE — Patient Instructions (Signed)
IUD Removal: Care Instructions  Your Care Instructions     The intrauterine device (IUD) is a method of birth control. It is a small, plastic, T-shaped device that contains copper or hormones. It is placed in your uterus. You may have had your IUD removed because you want to become pregnant. Or maybe it caused pain, bleeding, or an infection. You may have chosen another method of birth control. If you don't want to get pregnant, make sure to use another form of birth control now that your IUD is not in place. Talk to your doctor about other forms of birth control.  Follow-up care is a key part of your treatment and safety. Be sure to make and go to all appointments, and call your doctor if you are having problems. It's also a good idea to know your test results and keep a list of the medicines you take.  How can you care for yourself at home?  ?? IUD removal does not usually cause any pain or problems if the IUD is removed because you want to become pregnant or because of bleeding.  ?? Once the IUD is taken out, you can become pregnant. If you want to become pregnant, you can start trying to have a baby as soon as you like.  ?? If your doctor prescribed antibiotics because of an infection, take them as directed. Do not stop taking them just because you feel better. You need to take the full course of antibiotics.  When should you call for help?   Call your doctor now or seek immediate medical care if:  ?? ?? You have pain in your belly or pelvis.   ?? ?? You have severe vaginal bleeding. This means that you are soaking through your usual pads or tampons every hour for 2 or more hours.   ?? ?? You have a fever.   ?? ?? You have a vaginal discharge that smells bad.   Watch closely for changes in your health, and be sure to contact your doctor if you have any problems.  Where can you learn more?  Go to https://www.healthwise.net/[facility]  Enter M721 in the search box to learn more about "IUD Removal: Care  Instructions."  Current as of: February 05, 2019??????????????????????????????Content Version: 12.6  ?? 2006-2020 [employer]wise[employer] Inc[employer].   Care instructions adapted under license by [employer] (which disclaims liability or warranty for this information). If you have questions about a medical condition or this instruction, always ask your healthcare professional. Healthwise, Incorporated disclaims any warranty or liability for your use of this information.

## 2019-10-27 LAB — CULTURE, URINE
Colonies Counted: >100000
Colony Count: >100000

## 2019-10-28 ENCOUNTER — Ambulatory Visit: Attending: Obstetrics & Gynecology

## 2019-10-28 ENCOUNTER — Encounter

## 2019-10-28 ENCOUNTER — Ambulatory Visit
Admit: 2019-10-28 | Discharge: 2019-10-28 | Payer: PRIVATE HEALTH INSURANCE | Attending: Obstetrics & Gynecology | Primary: Family Medicine

## 2019-10-28 ENCOUNTER — Encounter: Admit: 2019-10-28 | Payer: PRIVATE HEALTH INSURANCE | Primary: Family Medicine

## 2019-10-28 ENCOUNTER — Ambulatory Visit: Admit: 2019-10-28 | Discharge: 2019-10-28 | Payer: PRIVATE HEALTH INSURANCE | Primary: Family Medicine

## 2019-10-28 DIAGNOSIS — N730 Acute parametritis and pelvic cellulitis: Secondary | ICD-10-CM

## 2019-10-28 DIAGNOSIS — R102 Pelvic and perineal pain: Secondary | ICD-10-CM

## 2019-10-28 MED ORDER — CEFTRIAXONE 250 MG SOLUTION FOR INJECTION
250 mg | Freq: Once | INTRAMUSCULAR | 0 refills | Status: AC
Start: 2019-10-28 — End: 2019-10-28

## 2019-10-28 NOTE — Progress Notes (Signed)
Problem Visit    Kristina Hansen is a 18 y.o. presenting for ultrasound and follow up of PID and associated pelvic pain. Pt started doxy and flagyl prescribed at last office visit, but reports the pharmacy did not have cephtriaxone injection so she did not bring that with her today. Reports overall pelvic pain and discharge symptoms improved from last visit. Ultrasound today showing 2 small 3cm cysts on ROV, no apparent TOA, but cannot conclusively exclude fluid in tubes. Pt denies any fevers or chills or worsening pelvic pains.     Prior history:  H/o endometriosis. Mirena IUD placed June 2020 in [place] for endometriosis. Reports she was overall happy with IUD and was asymptomatic until 1-2 weeks ago at which point she first developed vaginal discharge and subsequently began to have increased pelvic pain mainly in suprapubic area. She was seen by Marlan Palau in office on 10/27, STI testing was obtained and cultures returned positive for Chlamydia and Trichomonas, and she was scheduled for an [place] the following day which she missed because she was in there ER due to acute worsening of pain. In ER she had pelvic XR, neg UPT, and was diagnosed with UTI (culture positive for GNRs) and given Rx for Omnicef. Pt seen Friday and was given Rx for ceftriaxone/doxy/flagyl for treatment of PID.     TRANSVAGINAL ULTRASOUND PERFORMED 10/28/19.  THE UTERUS IS RETROVERTED,NORMAL IN SIZE AND ECHOGENICITY.  IUD IS SEEN IN THE PROPER POSITION WITHIN THE ENDOMETRIAL CAVITY IN THE UTERINE FUNDUS.  THE RIGHT OVARY APPEARS TO HAVE TWO SIMPLE CYSTS MEASURING 30 X 18 X 31MM AND 31 X 28 X 31  MM. THERE APPEARS TO BE A AREA OF FREE FLUID ADJACENT TO THE RIGHT OVARY MEASURING 22 X  12 X 15 MM.  THE LEFT OVARY APPEARS WNL.  NO FREE FLUID SEEN IN THE CDS.    No past medical history on file.    No past surgical history on file.    Family History   Problem Relation Age of Onset   ??? No Known Problems Mother    ??? No Known Problems Father         Social History     Socioeconomic History   ??? Marital status: SINGLE     Spouse name: Not on file   ??? Number of children: Not on file   ??? Years of education: Not on file   ??? Highest education level: Not on file   Occupational History   ??? Not on file   Social Needs   ??? Financial resource strain: Not on file   ??? Food insecurity     Worry: Not on file     Inability: Not on file   ??? Transportation needs     Medical: Not on file     Non-medical: Not on file   Tobacco Use   ??? Smoking status: Never Smoker   ??? Smokeless tobacco: Current User   Substance and Sexual Activity   ??? Alcohol use: No   ??? Drug use: No   ??? Sexual activity: Not on file   Lifestyle   ??? Physical activity     Days per week: Not on file     Minutes per session: Not on file   ??? Stress: Not on file   Relationships   ??? Social Product manager on phone: Not on file     Gets together: Not on file     Attends religious service: Not  on file     Active member of club or organization: Not on file     Attends meetings of clubs or organizations: Not on file     Relationship status: Not on file   ??? Intimate partner violence     Fear of current or ex partner: Not on file     Emotionally abused: Not on file     Physically abused: Not on file     Forced sexual activity: Not on file   Other Topics Concern   ??? Not on file   Social History Narrative   ??? Not on file       Current Outpatient Medications   Medication Sig Dispense Refill   ??? doxycycline (ADOXA) 100 mg tablet Take 1 Tab by mouth two (2) times a day for 14 days. 28 Tab 0   ??? metroNIDAZOLE (FLAGYL) 500 mg tablet Take 1 Tab by mouth two (2) times a day for 14 days. 28 Tab 0   ??? cefdinir (OMNICEF) 300 mg capsule Take 1 Cap by mouth two (2) times a day for 7 days. 14 Cap 0   ??? ibuprofen (MOTRIN) 800 mg tablet Take 1 Tab by mouth every six (6) hours as needed for Pain. 90 Tab 1   ??? famotidine (PEPCID) 20 mg tablet Take 1 Tab by mouth two (2) times a day. 10 Tab 0       No Known Allergies    Review of  Systems - History obtained from the patient  Constitutional: negative for weight loss, fever, night sweats  HEENT: negative for hearing loss, earache, congestion, snoring, sorethroat  CV: negative for chest pain, palpitations, edema  Resp: negative for cough, shortness of breath, wheezing  GI: negative for change in bowel habits, abdominal pain, black or bloody stools  GU: negative for frequency, dysuria, hematuria, +vaginal discharge, odor, pelvic pain  MSK: negative for back pain, joint pain, muscle pain  Breast: negative for breast lumps, nipple discharge, galactorrhea  Skin :negative for itching, rash, hives  Neuro: negative for dizziness, headache, confusion, weakness  Psych: negative for anxiety, depression, change in mood  Heme/lymph: negative for bleeding, bruising, pallor    Physical Exam  Visit Vitals  BP 108/64 (BP 1 Location: Left arm, BP Patient Position: Sitting)   Ht 5\' 1"  (1.549 m)   Wt 101 lb (45.8 kg)   BMI 19.08 kg/m??       Constitutional  ?? Appearance: well-nourished, well developed, alert, in no acute distress    HENT  ?? Head and Face: appears normal    Chest  ?? Respiratory Effort: non-labored breathing  ?? Auscultation: CTAB, normal breath sounds    Cardiovascular  ?? Heart:  ?? Auscultation: regular rate and rhythm without murmur  ?? Extremities: no peripheral edema    Gastrointestinal  ?? Abdominal Examination: abdomen non-tender to palpation, normal bowel sounds, no masses present, no CVAT  ?? Liver and spleen: no hepatomegaly present, spleen not palpable  ?? Hernias: no hernias identified    Skin  ?? General Inspection: no rash, no lesions identified    Neurologic/Psychiatric  ?? Mental Status:  ?? Orientation: grossly oriented to person, place and time  ?? Mood and Affect: mood normal, affect appropriate      Assessment/Plan:  18 y.o. with PID (+chlamydia and +trichomonas) and UTI (cx pos for GNRs). H/o endometriosis, with Mirena IUD in situ.      -complete doxycycline 100mg  PO BID x 14d, and flagyl  500mg  BID  x 14 days --> pt to return for ceftriaxone injection tomorrow (paper Rx provided again today so no issues with pharmacy)  -reviewed precautions and reasons to go to ER for possible IV antibiotics, worsening pain, inability to tolerate PO, fevers despite antibiotics, etc  -discussed okay to leave Mirena IUD in place for now, but if PID and infections not resolving appropriately would recommend removal at that time  -advised partner treatment and no sex for at least 2 weeks after both partners adequately treated    RTC: 2 weeks for follow up and 6 wks for repeat TVUS and repeat STI testing    Gertie Exon, MD  10/28/2019  5:42 PM

## 2019-10-28 NOTE — Progress Notes (Signed)
Problem Visit    Kristina Hansen is a 18 y.o. presenting for ultrasound and follow up of PID and associated pelvic pain. Pt started doxy and flagyl prescribed at last office visit, but reports the pharmacy did not have cephtriaxone injection so she did not bring that with her today. Reports overall pelvic pain and discharge symptoms improved from last visit. Ultrasound today showing 2 small 3cm cysts on ROV, no apparent TOA, but cannot conclusively exclude fluid in tubes. Pt denies any fevers or chills or worsening pelvic pains.     Prior history:  H/o endometriosis. Mirena IUD placed June 2020 in [place] for endometriosis. Reports she was overall happy with IUD and was asymptomatic until 1-2 weeks ago at which point she first developed vaginal discharge and subsequently began to have increased pelvic pain mainly in suprapubic area. She was seen by Marlan Palau in office on 10/27, STI testing was obtained and cultures returned positive for Chlamydia and Trichomonas, and she was scheduled for an [place] the following day which she missed because she was in there ER due to acute worsening of pain. In ER she had pelvic XR, neg UPT, and was diagnosed with UTI (culture positive for GNRs) and given Rx for Omnicef. Pt seen Friday and was given Rx for ceftriaxone/doxy/flagyl for treatment of PID.     TRANSVAGINAL ULTRASOUND PERFORMED 10/28/19.  THE UTERUS IS RETROVERTED,NORMAL IN SIZE AND ECHOGENICITY.  IUD IS SEEN IN THE PROPER POSITION WITHIN THE ENDOMETRIAL CAVITY IN THE UTERINE FUNDUS.  THE RIGHT OVARY APPEARS TO HAVE TWO SIMPLE CYSTS MEASURING 30 X 18 X 31MM AND 31 X 28 X 31  MM. THERE APPEARS TO BE A AREA OF FREE FLUID ADJACENT TO THE RIGHT OVARY MEASURING 22 X  12 X 15 MM.  THE LEFT OVARY APPEARS WNL.  NO FREE FLUID SEEN IN THE CDS.    No past medical history on file.    No past surgical history on file.    Family History   Problem Relation Age of Onset   ??? No Known Problems Mother    ??? No Known Problems Father         Social History     Socioeconomic History   ??? Marital status: SINGLE     Spouse name: Not on file   ??? Number of children: Not on file   ??? Years of education: Not on file   ??? Highest education level: Not on file   Occupational History   ??? Not on file   Social Needs   ??? Financial resource strain: Not on file   ??? Food insecurity     Worry: Not on file     Inability: Not on file   ??? Transportation needs     Medical: Not on file     Non-medical: Not on file   Tobacco Use   ??? Smoking status: Never Smoker   ??? Smokeless tobacco: Current User   Substance and Sexual Activity   ??? Alcohol use: No   ??? Drug use: No   ??? Sexual activity: Not on file   Lifestyle   ??? Physical activity     Days per week: Not on file     Minutes per session: Not on file   ??? Stress: Not on file   Relationships   ??? Social Product manager on phone: Not on file     Gets together: Not on file     Attends religious service: Not  on file     Active member of club or organization: Not on file     Attends meetings of clubs or organizations: Not on file     Relationship status: Not on file   ??? Intimate partner violence     Fear of current or ex partner: Not on file     Emotionally abused: Not on file     Physically abused: Not on file     Forced sexual activity: Not on file   Other Topics Concern   ??? Not on file   Social History Narrative   ??? Not on file       Current Outpatient Medications   Medication Sig Dispense Refill   ??? doxycycline (ADOXA) 100 mg tablet Take 1 Tab by mouth two (2) times a day for 14 days. 28 Tab 0   ??? metroNIDAZOLE (FLAGYL) 500 mg tablet Take 1 Tab by mouth two (2) times a day for 14 days. 28 Tab 0   ??? cefdinir (OMNICEF) 300 mg capsule Take 1 Cap by mouth two (2) times a day for 7 days. 14 Cap 0   ??? ibuprofen (MOTRIN) 800 mg tablet Take 1 Tab by mouth every six (6) hours as needed for Pain. 90 Tab 1   ??? famotidine (PEPCID) 20 mg tablet Take 1 Tab by mouth two (2) times a day. 10 Tab 0       No Known Allergies     Review of Systems - History obtained from the patient  Constitutional: negative for weight loss, fever, night sweats  HEENT: negative for hearing loss, earache, congestion, snoring, sorethroat  CV: negative for chest pain, palpitations, edema  Resp: negative for cough, shortness of breath, wheezing  GI: negative for change in bowel habits, abdominal pain, black or bloody stools  GU: negative for frequency, dysuria, hematuria, +vaginal discharge, odor, pelvic pain  MSK: negative for back pain, joint pain, muscle pain  Breast: negative for breast lumps, nipple discharge, galactorrhea  Skin :negative for itching, rash, hives  Neuro: negative for dizziness, headache, confusion, weakness  Psych: negative for anxiety, depression, change in mood  Heme/lymph: negative for bleeding, bruising, pallor    Physical Exam  Visit Vitals  BP 108/64 (BP 1 Location: Left arm, BP Patient Position: Sitting)   Ht 5\' 1"  (1.549 m)   Wt 101 lb (45.8 kg)   BMI 19.08 kg/m??       Constitutional  ?? Appearance: well-nourished, well developed, alert, in no acute distress    HENT  ?? Head and Face: appears normal    Chest  ?? Respiratory Effort: non-labored breathing  ?? Auscultation: CTAB, normal breath sounds    Cardiovascular  ?? Heart:  ?? Auscultation: regular rate and rhythm without murmur  ?? Extremities: no peripheral edema    Gastrointestinal  ?? Abdominal Examination: abdomen non-tender to palpation, normal bowel sounds, no masses present, no CVAT  ?? Liver and spleen: no hepatomegaly present, spleen not palpable  ?? Hernias: no hernias identified    Skin  ?? General Inspection: no rash, no lesions identified    Neurologic/Psychiatric  ?? Mental Status:  ?? Orientation: grossly oriented to person, place and time  ?? Mood and Affect: mood normal, affect appropriate      Assessment/Plan:  18 y.o. with PID (+chlamydia and +trichomonas) and UTI (cx pos for GNRs). H/o endometriosis, with Mirena IUD in situ.       -complete doxycycline 100mg  PO BID x 14d, and flagyl 500mg  BID  x 14 days --> pt to return for ceftriaxone injection tomorrow (paper Rx provided again today so no issues with pharmacy)  -reviewed precautions and reasons to go to ER for possible IV antibiotics, worsening pain, inability to tolerate PO, fevers despite antibiotics, etc  -discussed okay to leave Mirena IUD in place for now, but if PID and infections not resolving appropriately would recommend removal at that time  -advised partner treatment and no sex for at least 2 weeks after both partners adequately treated    RTC: 2 weeks for follow up and 6 wks for repeat TVUS and repeat STI testing    Gertie Exon, MD  10/28/2019  5:42 PM

## 2019-10-28 NOTE — Patient Instructions (Signed)
Pelvic Inflammatory Disease: Care Instructions  Your Care Instructions     Pelvic inflammatory disease, or PID, is an infection of your fallopian tubes and other reproductive organs. PID is usually caused by a sexually transmitted infection (STI), such as gonorrhea or chlamydia. PID can cause scars in the fallopian tubes. This can make it hard for you to get pregnant in the future.  It's important to take all the medicine that was prescribed. PID can cause serious health problems if you do not complete your treatment.  Having one STI increases your risk for other STIs, such as genital herpes, genital warts, syphilis, and HIV. It's a good idea to start thinking about prevention now. Not having sex is the best way to prevent any STI.  Follow-up care is a key part of your treatment and safety. Be sure to make and go to all appointments, and call your doctor if you are having problems. It's also a good idea to know your test results and keep a list of the medicines you take.  How can you care for yourself at home?  ?? Take your antibiotics as directed. Do not stop taking them just because you feel better. You need to take the full course of antibiotics.  ?? Rest until your fever and pain have improved.  ?? Take pain medicines exactly as directed.  ? If the doctor gave you a prescription medicine for pain, take it as prescribed.  ? If you are not taking a prescription pain medicine, ask your doctor if you can take an over-the-counter medicine.  ?? Use a hot water bottle or a heating pad (set on low) on your belly for pain.  ?? Do not douche.  ?? Do not have sex or use tampons (you can use pads instead) until you have taken all the medicine, your pain is gone, and you feel completely well.  ?? Talk to any sex partners you have had in the past 2 months. They need to be tested and may need to be treated for STIs.  To prevent STIs  ?? Use latex condoms every time you have sex. Use them from the beginning  to the end of sexual contact.  ?? Talk to your partner before you have sex. Find out if he or she has or is at risk for any sexually transmitted infection (STI). Keep in mind that a person may be able to spread an STI even if he or she does not have symptoms.  ?? Do not have sex with anyone who has symptoms of an STI, such as sores on the genitals or mouth.  ?? Having one sex partner (who does not have STIs and does not have sex with anyone else) is a good way to avoid STIs.  When should you call for help?   Call your doctor now or seek immediate medical care if:  ?? ?? You have a new or higher fever.   ?? ?? You have unusual vaginal bleeding.   ?? ?? You have new or worse belly or pelvic pain.   ?? ?? You have vaginal discharge that has increased in amount or smells bad.   Watch closely for changes in your health, and be sure to contact your doctor if:  ?? ?? You do not get better as expected.   Where can you learn more?  Go to https://www.healthwise.net/[facility]  Enter N294 in the search box to learn more about "Pelvic Inflammatory Disease: Care Instructions."  Current as of: November 02, 2018??????????????????????????????Content Version: 12.6  ?? 2006-2020 [employer].   Care instructions adapted under license by [employer] (which disclaims liability or warranty for this information). If you have questions about a medical condition or this instruction, always ask your healthcare professional. Healthwise, Incorporated disclaims any warranty or liability for your use of this information.

## 2019-10-29 ENCOUNTER — Encounter: Attending: Obstetrics & Gynecology | Primary: Family Medicine

## 2019-11-13 ENCOUNTER — Encounter: Attending: Obstetrics & Gynecology | Primary: Family Medicine

## 2019-11-13 NOTE — Progress Notes (Deleted)
Problem Visit    Kristina Hansen is a 18 y.o. presenting for ultrasound and follow up of PID and associated pelvic pain. Pt started doxy and flagyl prescribed at last office visit, but reports the pharmacy did not have cephtriaxone injection so she did not bring that with her today. Reports overall pelvic pain and discharge symptoms improved from last visit. Ultrasound today showing 2 small 3cm cysts on ROV, no apparent TOA, but cannot conclusively exclude fluid in tubes. Pt denies any fevers or chills or worsening pelvic pains.     Prior history:  H/o endometriosis. Mirena IUD placed June 2020 in [place] for endometriosis. Reports she was overall happy with IUD and was asymptomatic until 1-2 weeks ago at which point she first developed vaginal discharge and subsequently began to have increased pelvic pain mainly in suprapubic area. She was seen by Ricard DillonNM Parker in office on 10/27, STI testing was obtained and cultures returned positive for Chlamydia and Trichomonas, and she was scheduled for an [place] the following day which she missed because she was in there ER due to acute worsening of pain. In ER she had pelvic XR, neg UPT, and was diagnosed with UTI (culture positive for GNRs) and given Rx for Omnicef. Pt seen Friday and was given Rx for ceftriaxone/doxy/flagyl for treatment of PID.     TRANSVAGINAL ULTRASOUND PERFORMED 10/28/19.  THE UTERUS IS RETROVERTED,NORMAL IN SIZE AND ECHOGENICITY.  IUD IS SEEN IN THE PROPER POSITION WITHIN THE ENDOMETRIAL CAVITY IN THE UTERINE FUNDUS.  THE RIGHT OVARY APPEARS TO HAVE TWO SIMPLE CYSTS MEASURING 30 X 18 X 31MM AND 31 X 28 X 31  MM. THERE APPEARS TO BE A AREA OF FREE FLUID ADJACENT TO THE RIGHT OVARY MEASURING 22 X  12 X 15 MM.  THE LEFT OVARY APPEARS WNL.  NO FREE FLUID SEEN IN THE CDS.    Past Medical History:   Diagnosis Date   ??? PID (acute pelvic inflammatory disease)        No past surgical history on file.    Family History   Problem Relation Age of Onset    ??? No Known Problems Mother    ??? No Known Problems Father        Social History     Socioeconomic History   ??? Marital status: SINGLE     Spouse name: Not on file   ??? Number of children: Not on file   ??? Years of education: Not on file   ??? Highest education level: Not on file   Occupational History   ??? Not on file   Social Needs   ??? Financial resource strain: Not on file   ??? Food insecurity     Worry: Not on file     Inability: Not on file   ??? Transportation needs     Medical: Not on file     Non-medical: Not on file   Tobacco Use   ??? Smoking status: Never Smoker   ??? Smokeless tobacco: Current User   Substance and Sexual Activity   ??? Alcohol use: No   ??? Drug use: No   ??? Sexual activity: Yes     Partners: Male     Birth control/protection: I.U.D.   Lifestyle   ??? Physical activity     Days per week: Not on file     Minutes per session: Not on file   ??? Stress: Not on file   Relationships   ??? Social connections  Talks on phone: Not on file     Gets together: Not on file     Attends religious service: Not on file     Active member of club or organization: Not on file     Attends meetings of clubs or organizations: Not on file     Relationship status: Not on file   ??? Intimate partner violence     Fear of current or ex partner: Not on file     Emotionally abused: Not on file     Physically abused: Not on file     Forced sexual activity: Not on file   Other Topics Concern   ??? Not on file   Social History Narrative   ??? Not on file       Current Outpatient Medications   Medication Sig Dispense Refill   ??? ibuprofen (MOTRIN) 800 mg tablet Take 1 Tab by mouth every six (6) hours as needed for Pain. 90 Tab 1   ??? famotidine (PEPCID) 20 mg tablet Take 1 Tab by mouth two (2) times a day. 10 Tab 0       No Known Allergies    Review of Systems - History obtained from the patient  Constitutional: negative for weight loss, fever, night sweats  HEENT: negative for hearing loss, earache, congestion, snoring, sorethroat   CV: negative for chest pain, palpitations, edema  Resp: negative for cough, shortness of breath, wheezing  GI: negative for change in bowel habits, abdominal pain, black or bloody stools  GU: negative for frequency, dysuria, hematuria, +vaginal discharge, odor, pelvic pain  MSK: negative for back pain, joint pain, muscle pain  Breast: negative for breast lumps, nipple discharge, galactorrhea  Skin :negative for itching, rash, hives  Neuro: negative for dizziness, headache, confusion, weakness  Psych: negative for anxiety, depression, change in mood  Heme/lymph: negative for bleeding, bruising, pallor    Physical Exam  There were no vitals taken for this visit.    Constitutional  ?? Appearance: well-nourished, well developed, alert, in no acute distress    HENT  ?? Head and Face: appears normal    Chest  ?? Respiratory Effort: non-labored breathing  ?? Auscultation: CTAB, normal breath sounds    Cardiovascular  ?? Heart:  ?? Auscultation: regular rate and rhythm without murmur  ?? Extremities: no peripheral edema    Gastrointestinal  ?? Abdominal Examination: abdomen non-tender to palpation, normal bowel sounds, no masses present, no CVAT  ?? Liver and spleen: no hepatomegaly present, spleen not palpable  ?? Hernias: no hernias identified    Skin  ?? General Inspection: no rash, no lesions identified    Neurologic/Psychiatric  ?? Mental Status:  ?? Orientation: grossly oriented to person, place and time  ?? Mood and Affect: mood normal, affect appropriate      Assessment/Plan:  18 y.o. with PID (+chlamydia and +trichomonas) and UTI (cx pos for GNRs). H/o endometriosis, with Mirena IUD in situ.      -complete doxycycline 100mg  PO BID x 14d, and flagyl 500mg  BID x 14 days --> pt to return for ceftriaxone injection tomorrow (paper Rx provided again today so no issues with pharmacy)  -reviewed precautions and reasons to go to ER for possible IV antibiotics, worsening pain, inability to tolerate PO, fevers despite antibiotics, etc   -discussed okay to leave Mirena IUD in place for now, but if PID and infections not resolving appropriately would recommend removal at that time  -advised partner treatment and no sex for  at least 2 weeks after both partners adequately treated    RTC: 2 weeks for follow up and 6 wks for repeat TVUS and repeat STI testing

## 2019-12-09 ENCOUNTER — Encounter: Primary: Family Medicine

## 2019-12-09 ENCOUNTER — Ambulatory Visit: Attending: Obstetrics & Gynecology | Primary: Family Medicine

## 2019-12-09 ENCOUNTER — Ambulatory Visit: Primary: Family Medicine

## 2019-12-09 ENCOUNTER — Encounter

## 2020-06-10 ENCOUNTER — Telehealth: Payer: Self-pay

## 2020-06-10 NOTE — Telephone Encounter (Signed)
Left message for pt to return call to triage RN. 

## 2020-06-10 NOTE — Telephone Encounter (Signed)
AEX 08/2018 Mirena IUD placed 07/02/2019 H/O PID H/O Chlamydia and trich 10/2019 in [place] +BV 10/2018  Spoke with pt. Pt reports having "urinary" odor with clear vaginal discharge x 1 week. Pt denies any urinary burning, pain, frequency and urgency, fever, chills or back pain. Pt was seen last in our office for AEX in 2019 and just recently relocated back from [place], [place]. Pt states is SA and only 1 partner. Has hx of STDs and PID.  Spoke with Dr Salli Quarry CMA and was advised pt to be seen at urgent care due to no available appts today or tomorrow. Pt given recommendations. Pt agreeable and verbalized understanding. Pt to return call to office if needs follow up. Pt agreeable.   Routing to Dr Oscar La for review.  Encounter closed.

## 2020-06-10 NOTE — Telephone Encounter (Signed)
Patient is calling in regards to a odor when urinating.

## 2020-06-16 ENCOUNTER — Other Ambulatory Visit: Payer: Self-pay

## 2020-06-16 ENCOUNTER — Encounter (HOSPITAL_COMMUNITY): Payer: Self-pay

## 2020-06-16 ENCOUNTER — Ambulatory Visit (HOSPITAL_COMMUNITY)
Admission: EM | Admit: 2020-06-16 | Discharge: 2020-06-16 | Disposition: A | Discharge status: Home / Self Care | Payer: Medicaid Other | Attending: Family Medicine | Admitting: Family Medicine

## 2020-06-16 DIAGNOSIS — Z113 Encounter for screening for infections with a predominantly sexual mode of transmission: Secondary | ICD-10-CM | POA: Diagnosis not present

## 2020-06-16 DIAGNOSIS — Z3202 Encounter for pregnancy test, result negative: Secondary | ICD-10-CM | POA: Diagnosis not present

## 2020-06-16 DIAGNOSIS — R8290 Unspecified abnormal findings in urine: Secondary | ICD-10-CM | POA: Diagnosis present

## 2020-06-16 LAB — POC URINE PREG, ED: Preg Test, Ur: NEGATIVE

## 2020-06-16 LAB — POCT URINALYSIS DIP (DEVICE)
Bilirubin Urine: NEGATIVE
Glucose, UA: NEGATIVE mg/dL
Hgb urine dipstick: NEGATIVE
Leukocytes,Ua: NEGATIVE
Nitrite: POSITIVE — AB
Protein, ur: NEGATIVE mg/dL
Specific Gravity, Urine: >=1.03 (ref 1.005–1.030)
Urobilinogen, UA: 0.2 mg/dL (ref 0.0–1.0)
pH: 6.5 (ref 5.0–8.0)

## 2020-06-16 NOTE — Discharge Instructions (Signed)
We are sending urine for a culture to confirm any infection We are testing you for Gonorrhea, Chlamydia, Trichomonas, Yeast and Bacterial Vaginosis. We will call you if anything is positive and let you know if you require any further treatment. Please inform partners of any positive results.   Please return if symptoms not improving with treatment, development of fever, nausea, vomiting, abdominal pain.

## 2020-06-16 NOTE — ED Provider Notes (Signed)
[facility]    CSN: [id number] Arrival date & time: 06/16/20  1817      History   Chief Complaint Chief Complaint  Patient presents with  . SEXUALLY TRANSMITTED DISEASE    HPI Mercedes Guzman is a 19 y.o. female presenting today for evaluation of urinary odor.  Patient reports that over the past couple days she has had foul-smelling urine.  She has had some abdominal cramping for 2 days which she has attributed to her menstrual cycle.  She denies any discharge.  Denies dysuria, increased frequency or urgency.  Denies itching or irritation.  Has had prior UTI as well as BV, she is unsure if symptoms feel more similar to either.  Does report some occasional nausea, denies vomiting.  HPI  Past Medical History:  Diagnosis Date  . Anemia   . Anxiety   . Depression   . Dysmenorrhea   . Migraine without aura   . STD (sexually transmitted disease)    HSV II    Patient Active Problem List   Diagnosis Date Noted  . Dysmenorrhea 03/27/2014    History reviewed. No pertinent surgical history.  OB History    Gravida  0   Para  0   Term  0   Preterm  0   AB  0   Living  0     SAB  0   TAB  0   Ectopic  0   Multiple  0   Live Births               Home Medications    Prior to Admission medications   Medication Sig Start Date End Date Taking? Authorizing Provider  ibuprofen (ADVIL) 200 MG tablet Take 200 mg by mouth every 6 (six) hours as needed.   Yes [provider]  levonorgestrel (MIRENA) 20 MCG/24HR IUD 1 each by Intrauterine route once.    [provider]    Family History Family History  Problem Relation Age of Onset  . Endometriosis Mother   . Sarcoidosis Mother   . Heart disease Father     Social History Social History   Tobacco Use  . Smoking status: Current Every Day Smoker    Types: Cigarettes  . Smokeless tobacco: Never Used  Vaping Use  . Vaping Use: Never used  Substance Use Topics  . Alcohol use:  No  . Drug use: No     Allergies   Bactrim [sulfamethoxazole-trimethoprim]   Review of Systems Review of Systems  Constitutional: Negative for fever.  Respiratory: Negative for shortness of breath.   Cardiovascular: Negative for chest pain.  Gastrointestinal: Negative for abdominal pain, diarrhea, nausea and vomiting.  Genitourinary: Positive for vaginal bleeding. Negative for dysuria, flank pain, genital sores, hematuria, menstrual problem, vaginal discharge and vaginal pain.  Musculoskeletal: Negative for back pain.  Skin: Negative for rash.  Neurological: Negative for dizziness, light-headedness and headaches.     Physical Exam Triage Vital Signs ED Triage Vitals  Enc Vitals Group     BP 06/16/20 1836 112/70     Pulse Rate 06/16/20 1836 88     Resp 06/16/20 1836 18     Temp 06/16/20 1836 98 F (36.7 C)     Temp Source 06/16/20 1836 Oral     SpO2 06/16/20 1836 100 %     Weight --      Height --      Head Circumference --      Peak Flow --  Pain Score 06/16/20 1837 0     Pain Loc --      Pain Edu? --      Excl. in GC? --    No data found.  Updated Vital Signs BP 112/70 (BP Location: Right Arm)   Pulse 88   Temp 98 F (36.7 C) (Oral)   Resp 18   LMP 06/16/2020 (Exact Date)   SpO2 100%   Visual Acuity Right Eye Distance:   Left Eye Distance:   Bilateral Distance:    Right Eye Near:   Left Eye Near:    Bilateral Near:     Physical Exam Vitals and nursing note reviewed.  Constitutional:      Appearance: She is well-developed.     Comments: No acute distress  HENT:     Head: Normocephalic and atraumatic.     Nose: Nose normal.  Eyes:     Conjunctiva/sclera: Conjunctivae normal.  Cardiovascular:     Rate and Rhythm: Normal rate.  Pulmonary:     Effort: Pulmonary effort is normal. No respiratory distress.  Abdominal:     General: There is no distension.     Comments: Soft, nondistended, nontender to light and deep palpation throughout  abdomen  Musculoskeletal:        General: Normal range of motion.     Cervical back: Neck supple.  Skin:    General: Skin is warm and dry.  Neurological:     Mental Status: She is alert and oriented to person, place, and time.      UC Treatments / Results  Labs (all labs ordered are listed, but only abnormal results are displayed) Labs Reviewed  POCT URINALYSIS DIP (DEVICE) - Abnormal; Notable for the following components:      Result Value   Ketones, ur TRACE (*)    Nitrite POSITIVE (*)    All other components within normal limits  URINE CULTURE  POC URINE PREG, ED  CERVICOVAGINAL ANCILLARY ONLY    EKG   Radiology No results found.  Procedures Procedures (including critical care time)  Medications Ordered in UC Medications - No data to display  Initial Impression / Assessment and Plan / UC Course  I have reviewed the triage vital signs and the nursing notes.  Pertinent labs & imaging results that were available during my care of the patient were reviewed by me and considered in my medical decision making (see chart for details).     Pregnancy test negative, positive nitrites, but UA otherwise unremarkable, will send for culture to further evaluate for possible UTI as cause of odor.  Minimal other UTI symptoms.  Vaginal swab pending to screen for STDs as well as yeast and BV.  Patient wishes to defer empiric treatment until results return.  Recommend pushing fluids, will be in contact with results and provide further treatment as needed. Discussed strict return precautions. Patient verbalized understanding and is agreeable with plan.   Final Clinical Impressions(s) / UC Diagnoses   Final diagnoses:  Abnormal urine odor  Screen for STD (sexually transmitted disease)     Discharge Instructions     We are sending urine for a culture to confirm any infection We are testing you for Gonorrhea, Chlamydia, Trichomonas, Yeast and Bacterial Vaginosis. We will call  you if anything is positive and let you know if you require any further treatment. Please inform partners of any positive results.   Please return if symptoms not improving with treatment, development of fever, nausea, vomiting, abdominal  pain.    ED Prescriptions    None     PDMP not reviewed this encounter.   Janith Lima, [place] 06/16/20 1931

## 2020-06-16 NOTE — ED Triage Notes (Signed)
Pt presents today for foul smelling urine and abdominal pain. Pt believes she has BV. Pt states she usually does not have cramps with period but has been experiencing abdominal discomfort for the past 2 days. Pt denies burning with urination, itching, or discharge. Pt requesting STD testing.

## 2020-06-17 ENCOUNTER — Telehealth (HOSPITAL_COMMUNITY): Payer: Self-pay | Admitting: Orthopedic Surgery

## 2020-06-17 LAB — CERVICOVAGINAL ANCILLARY ONLY
Bacterial Vaginitis (gardnerella): POSITIVE — AB
Candida Glabrata: NEGATIVE
Candida Vaginitis: NEGATIVE
Chlamydia: NEGATIVE
Comment: NEGATIVE
Comment: NEGATIVE
Comment: NEGATIVE
Comment: NEGATIVE
Comment: NEGATIVE
Comment: NORMAL
Neisseria Gonorrhea: NEGATIVE
Trichomonas: NEGATIVE

## 2020-06-17 MED ORDER — METRONIDAZOLE 500 MG PO TABS
500.0000 mg | ORAL_TABLET | Freq: Two times a day (BID) | ORAL | 0 refills | Status: DC
Start: 2020-06-17 — End: 2020-10-15

## 2020-06-19 ENCOUNTER — Telehealth (HOSPITAL_COMMUNITY): Payer: Self-pay | Admitting: Orthopedic Surgery

## 2020-06-19 LAB — URINE CULTURE: Culture: >=100000 — AB

## 2020-06-19 MED ORDER — NITROFURANTOIN MONOHYD MACRO 100 MG PO CAPS
100.0000 mg | ORAL_CAPSULE | Freq: Two times a day (BID) | ORAL | 0 refills | Status: DC
Start: 2020-06-19 — End: 2020-10-15

## 2020-10-14 NOTE — Progress Notes (Signed)
19 y.o. G0P0000 Single Black or African American Not Hispanic or Latino female here for annual exam.  Patient states that she has been having intermit spotting and bleeding for about one month.  She has a mirena IUD, cycles every 1-2 months.  (one skipped month). She has been bleeding every other month for a whole month for the last 6 months. She goes from spotting to a "normal cycle" back to spotting. She changes a super + tampon every 4 hours. Still having cramps, not as bad as prior to the IUD. Prior to the last 6 months she was bleeding for a week at a time.   She is sexually active, same partner x 9 months. Sometimes uses condoms. No dyspareunia.  H/O PID last year,+Chlamydia and trich.   H/O HSV, only one outbreaks. Would like valtrex in case she has an outbreak.   H/O severe dysmenorrhea  H/O bilateral breast masses, followed with ultrasound Last ultrasound in 4/20: FINDINGS: Targeted ultrasound is performed, showing stable appearance of an oval, circumscribed hypoechoic mass at the 1 o'clock position 5 cm from the nipple. It measures 7 x 7 x 5 mm (previously 8 x 6 x 4 mm).  Within the left breast, 2 stable masses are also identified. This includes a mass at the 10 o'clock position 5 cm from the nipple measuring 1.7 x 1.6 x 0.7 cm (previously 1.7 x 1.6 x 0.8 cm) and a mass at the 10 o'clock position 6 cm from the nipple measuring 1 x 1 x 0.4 cm (previously 1.1 x 1.1 x 0.4 cm).  Follow up ultrasound recommended in 6 months    Patient's last menstrual period was 09/15/2020.          Sexually active: Yes.    The current method of family planning is IUD.  Mirena inserted in 6/20  Exercising: No.  The patient does not participate in regular exercise at present. Smoker:  Yes Vaping every day.   Health Maintenance: Pap:  Never  History of abnormal  Pap:  no MMG:  None  BMD:   None  Colonoscopy: none  TDaP:  utd per patient  Gardasil: complete per patient    reports that she  has been smoking cigarettes. She has never used smokeless tobacco. She reports that she does not drink alcohol and does not use drugs. Social ETOH. She is working at [employer].   Past Medical History:  Diagnosis Date  . Anemia   . Anxiety   . Depression   . Dysmenorrhea   . History of chlamydia   . History of PID   . Migraine without aura   . STD (sexually transmitted disease)    HSV II    History reviewed. No pertinent surgical history.  Current Outpatient Medications  Medication Sig Dispense Refill  . ibuprofen (ADVIL) 200 MG tablet Take 200 mg by mouth every 6 (six) hours as needed.    Marland Kitchen levonorgestrel (MIRENA) 20 MCG/24HR IUD 1 each by Intrauterine route once.    . valACYclovir (VALTREX) 500 MG tablet Take one tablet BID x 3 days as needed. 30 tablet 1   No current facility-administered medications for this visit.    Family History  Problem Relation Age of Onset  . Endometriosis Mother   . Sarcoidosis Mother   . Heart disease Father     Review of Systems  Genitourinary: Positive for vaginal bleeding.  All other systems reviewed and are negative.   Exam:   BP 122/64   Pulse Marland Kitchen)  101   Ht 5' 2.25" (1.581 m)   Wt 120 lb (54.4 kg)   LMP 09/15/2020   SpO2 98%   BMI 21.77 kg/m   Weight change: @WEIGHTCHANGE @ Height:   Height: 5' 2.25" (158.1 cm)  Ht Readings from Last 3 Encounters:  10/15/20 5' 2.25" (1.581 m)  03/11/19 5\' 2"  (1.575 m) (18 %, Z= -0.90)*  11/20/18 5\' 3"  (1.6 m) (31 %, Z= -0.51)*   * Growth percentiles are based on CDC (Girls, 2-20 Years) data.    General appearance: alert, cooperative and appears stated age Head: Normocephalic, without obvious abnormality, atraumatic Neck: no adenopathy, supple, symmetrical, trachea midline and thyroid normal to inspection and palpation Lungs: clear to auscultation bilaterally Cardiovascular: regular rate and rhythm Breasts: in the left breast at 9-10 o'clock is a stable lima bean shaped lump, in the right breast  at 1-2 o'clock near the periphery is a small, mobile, stable lump. No skin changes. Abdomen: soft, non-tender; non distended,  no masses,  no organomegaly Extremities: extremities normal, atraumatic, no cyanosis or edema Skin: Skin color, texture, turgor normal. No rashes or lesions Lymph nodes: Cervical, supraclavicular, and axillary nodes normal. No abnormal inguinal nodes palpated Neurologic: Grossly normal   Pelvic: External genitalia:  no lesions              Urethra:  normal appearing urethra with no masses, tenderness or lesions              Bartholins and Skenes: normal                 Vagina: normal appearing vagina with normal color and discharge, no lesions              Cervix: no cervical motion tenderness, no lesions and IUD string 1 cm               Bimanual Exam:  Uterus:  normal size, contour, position, consistency, mobility, non-tender and retroverted              Adnexa: no mass, fullness, tenderness               Rectovaginal: Confirms               Anus:  normal sphincter tone, no lesions  chaperoned for the exam.  A:  Well Woman with normal exam  BTB with IUD  H/O PID/Chlamydia and HSV  Bilateral breast lumps, overdue for f/u ultrasound  P:   Pap with std testing  Blood work for STD testing  CBC (other lab work UTD)  Use OCP's for the next 3 months, then monitor bleeding  Call if bleeding doesn't resolve  No contraindications to OCP's, risks reviewed  Set up f/u breast ultrasound  Discussed breast self exam  Discussed calcium and vit D intake  Condom use encouraged

## 2020-10-15 ENCOUNTER — Other Ambulatory Visit (HOSPITAL_COMMUNITY)
Admission: RE | Admit: 2020-10-15 | Discharge: 2020-10-15 | Disposition: A | Discharge status: Home / Self Care | Payer: Medicaid Other | Source: Ambulatory Visit | Attending: Obstetrics and Gynecology | Admitting: Obstetrics and Gynecology

## 2020-10-15 ENCOUNTER — Telehealth: Payer: Self-pay | Admitting: *Deleted

## 2020-10-15 ENCOUNTER — Ambulatory Visit: Payer: 59 | Admitting: Obstetrics and Gynecology

## 2020-10-15 ENCOUNTER — Other Ambulatory Visit: Payer: Self-pay

## 2020-10-15 ENCOUNTER — Ambulatory Visit: Payer: Medicaid Other | Admitting: Obstetrics and Gynecology

## 2020-10-15 ENCOUNTER — Encounter: Payer: Self-pay | Admitting: Obstetrics and Gynecology

## 2020-10-15 VITALS — BP 122/64 | HR 101 | Ht 62.25 in | Wt 120.0 lb

## 2020-10-15 DIAGNOSIS — Z113 Encounter for screening for infections with a predominantly sexual mode of transmission: Secondary | ICD-10-CM

## 2020-10-15 DIAGNOSIS — Z124 Encounter for screening for malignant neoplasm of cervix: Secondary | ICD-10-CM | POA: Diagnosis not present

## 2020-10-15 DIAGNOSIS — Z01419 Encounter for gynecological examination (general) (routine) without abnormal findings: Secondary | ICD-10-CM

## 2020-10-15 DIAGNOSIS — Z8619 Personal history of other infectious and parasitic diseases: Secondary | ICD-10-CM | POA: Insufficient documentation

## 2020-10-15 DIAGNOSIS — N63 Unspecified lump in unspecified breast: Secondary | ICD-10-CM

## 2020-10-15 DIAGNOSIS — Z8742 Personal history of other diseases of the female genital tract: Secondary | ICD-10-CM | POA: Diagnosis not present

## 2020-10-15 DIAGNOSIS — Z0000 Encounter for general adult medical examination without abnormal findings: Secondary | ICD-10-CM

## 2020-10-15 MED ORDER — NORETHIN ACE-ETH ESTRAD-FE 1-20 MG-MCG PO TABS
1.0000 | ORAL_TABLET | Freq: Every day | ORAL | 0 refills | Status: DC
Start: 2020-10-15 — End: 2021-01-01

## 2020-10-15 MED ORDER — VALACYCLOVIR HCL 500 MG PO TABS: ORAL_TABLET | ORAL | 1 refills | Status: DC

## 2020-10-15 NOTE — Patient Instructions (Signed)
EXERCISE AND DIET:  We recommended that you start or continue a regular exercise program for good health. Regular exercise means any activity that makes your heart beat faster and makes you sweat.  We recommend exercising at least 30 minutes per day at least 3 days a week, preferably 4 or 5.  We also recommend a diet low in fat and sugar.  Inactivity, poor dietary choices and obesity can cause diabetes, heart attack, stroke, and kidney damage, among others.    ALCOHOL AND SMOKING:  Women should limit their alcohol intake to no more than 7 drinks/beers/glasses of wine (combined, not each!) per week. Moderation of alcohol intake to this level decreases your risk of breast cancer and liver damage. And of course, no recreational drugs are part of a healthy lifestyle.  And absolutely no smoking or even second hand smoke. Most people know smoking can cause heart and lung diseases, but did you know it also contributes to weakening of your bones? Aging of your skin?  Yellowing of your teeth and nails?  CALCIUM AND VITAMIN D:  Adequate intake of calcium and Vitamin D are recommended.  The recommendations for exact amounts of these supplements seem to change often, but generally speaking 1,000 mg of calcium (between diet and supplement) and 800 units of Vitamin D per day seems prudent. Certain women may benefit from higher intake of Vitamin D.  If you are among these women, your doctor will have told you during your visit.    PAP SMEARS:  Pap smears, to check for cervical cancer or precancers,  have traditionally been done yearly, although recent scientific advances have shown that most women can have pap smears less often.  However, every woman still should have a physical exam from her gynecologist every year. It will include a breast check, inspection of the vulva and vagina to check for abnormal growths or skin changes, a visual exam of the cervix, and then an exam to evaluate the size and shape of the uterus and  ovaries.  And after 21 years of age, a rectal exam is indicated to check for rectal cancers. We will also provide age appropriate advice regarding health maintenance, like when you should have certain vaccines, screening for sexually transmitted diseases, bone density testing, colonoscopy, mammograms, etc.   MAMMOGRAMS:  All women over 40 years old should have a yearly mammogram. Many facilities now offer a "3D" mammogram, which may cost around $50 extra out of pocket. If possible,  we recommend you accept the option to have the 3D mammogram performed.  It both reduces the number of women who will be called back for extra views which then turn out to be normal, and it is better than the routine mammogram at detecting truly abnormal areas.    COLON CANCER SCREENING: Now recommend starting at age 45. At this time colonoscopy is not covered for routine screening until 50. There are take home tests that can be done between 45-49.   COLONOSCOPY:  Colonoscopy to screen for colon cancer is recommended for all women at age 50.  We know, you hate the idea of the prep.  We agree, BUT, having colon cancer and not knowing it is worse!!  Colon cancer so often starts as a polyp that can be seen and removed at colonscopy, which can quite literally save your life!  And if your first colonoscopy is normal and you have no family history of colon cancer, most women don't have to have it again for   10 years.  Once every ten years, you can do something that may end up saving your life, right?  We will be happy to help you get it scheduled when you are ready.  Be sure to check your insurance coverage so you understand how much it will cost.  It may be covered as a preventative service at no cost, but you should check your particular policy.      Breast Self-Awareness Breast self-awareness means being familiar with how your breasts look and feel. It involves checking your breasts regularly and reporting any changes to your  health care provider. Practicing breast self-awareness is important. A change in your breasts can be a sign of a serious medical problem. Being familiar with how your breasts look and feel allows you to find any problems early, when treatment is more likely to be successful. All women should practice breast self-awareness, including women who have had breast implants. How to do a breast self-exam One way to learn what is normal for your breasts and whether your breasts are changing is to do a breast self-exam. To do a breast self-exam: Look for Changes  1. Remove all the clothing above your waist. 2. Stand in front of a mirror in a room with good lighting. 3. Put your hands on your hips. 4. Push your hands firmly downward. 5. Compare your breasts in the mirror. Look for differences between them (asymmetry), such as: ? Differences in shape. ? Differences in size. ? Puckers, dips, and bumps in one breast and not the other. 6. Look at each breast for changes in your skin, such as: ? Redness. ? Scaly areas. 7. Look for changes in your nipples, such as: ? Discharge. ? Bleeding. ? Dimpling. ? Redness. ? A change in position. Feel for Changes Carefully feel your breasts for lumps and changes. It is best to do this while lying on your back on the floor and again while sitting or standing in the shower or tub with soapy water on your skin. Feel each breast in the following way:  Place the arm on the side of the breast you are examining above your head.  Feel your breast with the other hand.  Start in the nipple area and make  inch (2 cm) overlapping circles to feel your breast. Use the pads of your three middle fingers to do this. Apply light pressure, then medium pressure, then firm pressure. The light pressure will allow you to feel the tissue closest to the skin. The medium pressure will allow you to feel the tissue that is a little deeper. The firm pressure will allow you to feel the tissue  close to the ribs.  Continue the overlapping circles, moving downward over the breast until you feel your ribs below your breast.  Move one finger-width toward the center of the body. Continue to use the  inch (2 cm) overlapping circles to feel your breast as you move slowly up toward your collarbone.  Continue the up and down exam using all three pressures until you reach your armpit.  Write Down What You Find  Write down what is normal for each breast and any changes that you find. Keep a written record with breast changes or normal findings for each breast. By writing this information down, you do not need to depend only on memory for size, tenderness, or location. Write down where you are in your menstrual cycle, if you are still menstruating. If you are having trouble noticing differences   in your breasts, do not get discouraged. With time you will become more familiar with the variations in your breasts and more comfortable with the exam. How often should I examine my breasts? Examine your breasts every month. If you are breastfeeding, the best time to examine your breasts is after a feeding or after using a breast pump. If you menstruate, the best time to examine your breasts is 5-7 days after your period is over. During your period, your breasts are lumpier, and it may be more difficult to notice changes. When should I see my health care provider? See your health care provider if you notice:  A change in shape or size of your breasts or nipples.  A change in the skin of your breast or nipples, such as a reddened or scaly area.  Unusual discharge from your nipples.  A lump or thick area that was not there before.  Pain in your breasts.  Anything that concerns you.  Oral Contraception Information Oral contraceptive pills (OCPs) are medicines taken to prevent pregnancy. OCPs are taken by mouth, and they work by:  Preventing the ovaries from releasing eggs.  Thickening mucus in  the lower part of the uterus (cervix), which prevents sperm from entering the uterus.  Thinning the lining of the uterus (endometrium), which prevents a fertilized egg from attaching to the endometrium. OCPs are highly effective when taken exactly as prescribed. However, OCPs do not prevent STIs (sexually transmitted infections). Safe sex practices, such as using condoms while on an OCP, can help prevent STIs. Before starting OCPs Before you start taking OCPs, you may have a physical exam, blood test, and Pap test. However, you are not required to have a pelvic exam in order to be prescribed OCPs. Your health care provider will make sure you are a good candidate for oral contraception. OCPs are not a good option for certain women, including women who smoke and are older than 35 years, and women with a medical history of high blood pressure, deep vein thrombosis, pulmonary embolism, stroke, cardiovascular disease, or peripheral vascular disease. Discuss with your health care provider the possible side effects of the OCP you may be prescribed. When you start an OCP, be aware that it can take 2-3 months for your body to adjust to changes in hormone levels. Follow instructions from your health care provider about how to start taking your first cycle of OCPs. Depending on when you start the pill, you may need to use a backup form of birth control, such as condoms, during the first week. Make sure you know what steps to take if you ever forget to take the pill. Types of oral contraception  The most common types of birth control pills contain the hormones estrogen and progestin (synthetic progesterone) or progestin only. The combination pill This type of pill contains estrogen and progestin hormones. Combination pills often come in packs of 21, 28, or 91 pills. For each pack, the last 7 pills may not contain hormones, which means you may stop taking the pills for 7 days. Menstrual bleeding occurs during the  week that you do not take the pills or that you take the pills with no hormones in them. The minipill This type of pill contains the progestin hormone only. It comes in packs of 28 pills. All 28 pills contain the hormone. You take the pill every day. It is very important to take the pill at the same time each day. Advantages of oral contraceptive pills    Provides reliable and continuous contraception if taken as instructed.  May treat or decrease symptoms of: ? Menstrual period cramps. ? Irregular menstrual cycle or bleeding. ? Heavy menstrual flow. ? Abnormal uterine bleeding. ? Acne, depending on the type of pill. ? Polycystic ovarian syndrome. ? Endometriosis. ? Iron deficiency anemia. ? Premenstrual symptoms, including premenstrual dysphoric disorder.  May reduce the risk of endometrial and ovarian cancer.  Can be used as emergency contraception.  Prevents mislocated (ectopic) pregnancies and infections of the fallopian tubes. Things that can make oral contraceptive pills less effective OCPs can be less effective if:  You forget to take the pill at the same time every day. This is especially important when taking the minipill.  You have a stomach or intestinal disease that reduces your body's ability to absorb the pill.  You take OCPs with other medicines that make OCPs less effective, such as antibiotics, certain HIV medicines, and some seizure medicines.  You take expired OCPs.  You forget to restart the pill on day 7, if using the packs of 21 pills. Risks associated with oral contraceptive pills Oral contraceptive pills can sometimes cause side effects, such as:  Headache.  Depression.  Trouble sleeping.  Nausea and vomiting.  Breast tenderness.  Irregular bleeding or spotting during the first several months.  Bloating or fluid retention.  Increase in blood pressure. Combination pills are also associated with a small increase in the risk of:  Blood  clots.  Heart attack.  Stroke. Summary  Oral contraceptive pills are medicines taken by mouth to prevent pregnancy. They are highly effective when taken exactly as prescribed.  The most common types of birth control pills contain the hormones estrogen and progestin (synthetic progesterone) or progestin only.  Before you start taking the pill, you may have a physical exam, blood test, and Pap test. Your health care provider will make sure you are a good candidate for oral contraception.  The combination pill may come in a 21-day pack, a 28-day pack, or a 91-day pack. The minipill contains the progesterone hormone only and comes in packs of 28 pills.  Oral contraceptive pills can sometimes cause side effects, such as headache, nausea, breast tenderness, or irregular bleeding. This information is not intended to replace advice given to you by your health care provider. Make sure you discuss any questions you have with your health care provider. Document Revised: 11/24/2017 Document Reviewed: 03/07/2017 Elsevier Patient Education  2020 [employer]  

## 2020-10-15 NOTE — Telephone Encounter (Signed)
-----   Message from Romualdo Bolk, MD sent at 10/15/2020  1:28 PM EDT ----- This patient is overdue for f/u bilateral breast ultrasounds. Please schedule. Thanks, Noreene Larsson

## 2020-10-15 NOTE — Telephone Encounter (Signed)
Call placed to patient to confirm scheduling availability. Patient is requesting appt 3 wks out due to her work schedule, declines earlier appts. Advised I will call to schedule and return call, patient agreeable.   Call placed to [facility], spoke with [facility].  Patient scheduled for bilateral breast ultrasounds on 11/03/20 at 10:30am, arrive at 10:10am.   Call returned to patient, left detailed message, advised of appt information. Advised patient to contact [facility] directly if you need to make any changes to appt. Return call to office if any additional questions.   Placed in [facility] hold.   Encounter closed.

## 2020-10-16 LAB — CBC

## 2020-10-18 LAB — CBC

## 2020-10-19 ENCOUNTER — Telehealth: Payer: Self-pay

## 2020-10-19 LAB — CYTOLOGY - PAP
Chlamydia: NEGATIVE
Comment: NEGATIVE
Comment: NEGATIVE
Comment: NORMAL
Diagnosis: NEGATIVE
Neisseria Gonorrhea: NEGATIVE
Trichomonas: NEGATIVE

## 2020-10-19 NOTE — Telephone Encounter (Signed)
Spoke with patient. Notified that [facility] was unable to process labs from 10/15/20. Patient would like to return to office for recollection. Appointment scheduled for 10/26/2020 at 11:15 am. Patient is agreeable to date and time.  Routing to provider and will close encounter.

## 2020-10-20 LAB — CBC

## 2020-10-20 LAB — HEP, RPR, HIV PANEL
HIV Screen 4th Generation wRfx: NONREACTIVE
Hepatitis B Surface Ag: NEGATIVE
RPR Ser Ql: NONREACTIVE

## 2020-10-20 LAB — HEPATITIS C ANTIBODY: Hep C Virus Ab: <0.1 s/co ratio (ref 0.0–0.9)

## 2020-10-21 ENCOUNTER — Other Ambulatory Visit: Payer: Self-pay

## 2020-10-21 DIAGNOSIS — Z0000 Encounter for general adult medical examination without abnormal findings: Secondary | ICD-10-CM

## 2020-10-21 NOTE — Progress Notes (Signed)
Orders placed for lab redraw from 10/15/20. 

## 2020-10-26 ENCOUNTER — Encounter (HOSPITAL_COMMUNITY): Payer: Self-pay

## 2020-10-26 ENCOUNTER — Ambulatory Visit (HOSPITAL_COMMUNITY)
Admission: EM | Admit: 2020-10-26 | Discharge: 2020-10-26 | Disposition: A | Discharge status: Home / Self Care | Payer: Medicaid Other | Attending: Family Medicine | Admitting: Family Medicine

## 2020-10-26 ENCOUNTER — Other Ambulatory Visit: Payer: Self-pay

## 2020-10-26 ENCOUNTER — Other Ambulatory Visit: Payer: Medicaid Other

## 2020-10-26 DIAGNOSIS — J069 Acute upper respiratory infection, unspecified: Secondary | ICD-10-CM | POA: Diagnosis not present

## 2020-10-26 DIAGNOSIS — J029 Acute pharyngitis, unspecified: Secondary | ICD-10-CM | POA: Insufficient documentation

## 2020-10-26 DIAGNOSIS — F17210 Nicotine dependence, cigarettes, uncomplicated: Secondary | ICD-10-CM | POA: Insufficient documentation

## 2020-10-26 DIAGNOSIS — Z20822 Contact with and (suspected) exposure to covid-19: Secondary | ICD-10-CM | POA: Insufficient documentation

## 2020-10-26 MED ORDER — PSEUDOEPHEDRINE HCL 30 MG PO TABS
30.0000 mg | ORAL_TABLET | Freq: Three times a day (TID) | ORAL | 0 refills | Status: DC | PRN
Start: 2020-10-26 — End: 2021-01-01

## 2020-10-26 MED ORDER — CETIRIZINE HCL 10 MG PO TABS: 10.0000 mg | ORAL_TABLET | Freq: Every day | ORAL | 0 refills | Status: DC

## 2020-10-26 NOTE — ED Provider Notes (Signed)
[facility]   MRN: [id number] DOB: 10/24/2001  Subjective:   Mercedes Guzman is a 19 y.o. female presenting for 3-day history of acute onset dry cough, runny and stuffy nose, sore throat.  Cough does elicit chest pain.  Has not needed to use any medications for symptoms.  Denies fever, sinus pain, ear pain, shortness of breath, nausea, vomiting, body aches. Denies hx of asthma. Vapes but does not smoke.   No current facility-administered medications for this encounter.  Current Outpatient Medications:  .  norethindrone-ethinyl estradiol (LOESTRIN FE) 1-20 MG-MCG tablet, Take 1 tablet by mouth daily., Disp: 84 tablet, Rfl: 0 .  ibuprofen (ADVIL) 200 MG tablet, Take 200 mg by mouth every 6 (six) hours as needed., Disp: , Rfl:  .  levonorgestrel (MIRENA) 20 MCG/24HR IUD, 1 each by Intrauterine route once., Disp: , Rfl:  .  valACYclovir (VALTREX) 500 MG tablet, Take one tablet BID x 3 days as needed., Disp: 30 tablet, Rfl: 1   Allergies  Allergen Reactions  . Bactrim [Sulfamethoxazole-Trimethoprim] Anaphylaxis    Chest is tight    Past Medical History:  Diagnosis Date  . Anemia   . Anxiety   . Depression   . Dysmenorrhea   . History of chlamydia   . History of PID   . Migraine without aura   . STD (sexually transmitted disease)    HSV II     History reviewed. No pertinent surgical history.  Family History  Problem Relation Age of Onset  . Endometriosis Mother   . Sarcoidosis Mother   . Heart disease Father     Social History   Tobacco Use  . Smoking status: Current Every Day Smoker    Types: Cigarettes  . Smokeless tobacco: Never Used  Vaping Use  . Vaping Use: Every day  Substance Use Topics  . Alcohol use: No  . Drug use: No    ROS   Objective:   Vitals: BP (!) 105/57 (BP Location: Left Arm)   Pulse 96   Temp 98.6 F (37 C) (Oral)   Resp 18   LMP 10/14/2020 (Approximate)   SpO2 100%   Physical Exam Constitutional:       General: She is not in acute distress.    Appearance: Normal appearance. She is well-developed. She is not ill-appearing, toxic-appearing or diaphoretic.  HENT:     Head: Normocephalic and atraumatic.     Right Ear: Tympanic membrane and ear canal normal. No drainage or tenderness. No middle ear effusion. Tympanic membrane is not erythematous.     Left Ear: Tympanic membrane and ear canal normal. No drainage or tenderness.  No middle ear effusion. Tympanic membrane is not erythematous.     Nose: Congestion and rhinorrhea present.     Mouth/Throat:     Mouth: Mucous membranes are moist. No oral lesions.     Pharynx: No pharyngeal swelling, oropharyngeal exudate, posterior oropharyngeal erythema or uvula swelling.     Tonsils: No tonsillar exudate or tonsillar abscesses.     Comments: Post-nasal drainage overlying pharynx.  Eyes:     Extraocular Movements: Extraocular movements intact.     Right eye: Normal extraocular motion.     Left eye: Normal extraocular motion.     Conjunctiva/sclera: Conjunctivae normal.     Pupils: Pupils are equal, round, and reactive to light.  Cardiovascular:     Rate and Rhythm: Normal rate and regular rhythm.     Pulses: Normal pulses.  Heart sounds: Normal heart sounds. No murmur heard.  No friction rub. No gallop.   Pulmonary:     Effort: Pulmonary effort is normal. No respiratory distress.     Breath sounds: Normal breath sounds. No stridor. No wheezing, rhonchi or rales.  Musculoskeletal:     Cervical back: Normal range of motion and neck supple.  Lymphadenopathy:     Cervical: No cervical adenopathy.  Skin:    General: Skin is warm and dry.     Findings: No rash.  Neurological:     General: No focal deficit present.     Mental Status: She is alert and oriented to person, place, and time.  Psychiatric:        Mood and Affect: Mood normal.        Behavior: Behavior normal.        Thought Content: Thought content normal.      Assessment and  Plan :   PDMP not reviewed this encounter.  1. Viral URI with cough   2. Sore throat     Will manage for viral illness such as viral URI, viral syndrome, viral rhinitis, COVID-19. Counseled patient on nature of COVID-19 including modes of transmission, diagnostic testing, management and supportive care.  Offered scripts for symptomatic relief. COVID 19 testing is pending. Counseled patient on potential for adverse effects with medications prescribed/recommended today, ER and return-to-clinic precautions discussed, patient verbalized understanding.     Wallis Bamberg, PA-C 10/26/20 1342

## 2020-10-26 NOTE — Discharge Instructions (Addendum)

## 2020-10-26 NOTE — ED Triage Notes (Signed)
Pt in with c.o of st and dry cough that started a few days ago.  Has not had any meds for sxs  Denies fever, n/v, congestion, runny nose, sob, cp or other uri sxs

## 2020-10-27 LAB — SARS CORONAVIRUS 2 (TAT 6-24 HRS): SARS Coronavirus 2: NEGATIVE

## 2020-11-02 ENCOUNTER — Other Ambulatory Visit: Payer: Self-pay

## 2020-11-02 ENCOUNTER — Other Ambulatory Visit (INDEPENDENT_AMBULATORY_CARE_PROVIDER_SITE_OTHER): Payer: Medicaid Other

## 2020-11-02 DIAGNOSIS — Z0000 Encounter for general adult medical examination without abnormal findings: Secondary | ICD-10-CM

## 2020-11-03 ENCOUNTER — Other Ambulatory Visit: Payer: Medicaid Other

## 2020-11-03 LAB — CBC
Hematocrit: 36.2 % (ref 34.0–46.6)
Hemoglobin: 12.1 g/dL (ref 11.1–15.9)
MCH: 29.2 pg (ref 26.6–33.0)
MCHC: 33.4 g/dL (ref 31.5–35.7)
MCV: 87 fL (ref 79–97)
Platelets: 319 10*3/uL (ref 150–450)
RBC: 4.15 x10E6/uL (ref 3.77–5.28)
RDW: 12.2 % (ref 11.7–15.4)
WBC: 6.3 10*3/uL (ref 3.4–10.8)

## 2020-11-16 ENCOUNTER — Telehealth: Payer: Self-pay

## 2020-11-16 NOTE — Telephone Encounter (Signed)
Patient is calling in regards to discussing change in cycle with IUD.

## 2020-11-16 NOTE — Telephone Encounter (Signed)
Mirena IUD placed 05/2019 H/O PID last year,+Chlamydia and trich H/O HSV, Valtrex PRN outbreaks H/O severe dysmenorrhea Bilateral breast lumps, has breast US on 12/10/20 scheduled   Spoke with pt. Pt states having spotting that turned into dark blood on 11/13/20. Pt states usually has monthly menses even with IUD. Pt states " early start x 1 week" Pt states having quarter size clots, moderate cramps x 2 days. Pt has checked IUD strings and "feels the same"  Denies heavy bleeding, not soaking pads or tampons. Pt only changing pad/tampon every 4 hours to be clean, not d/t soaking.  Pt states this is different than other months of cycles. Pt denies any recent exposure to STDs, was SA x 1 week ago with same partner.  Pt has taken  Ibuprofen OTC as needed and helps resolve cramping.  Denies vaginal symptoms like itching, discharge and odor. Pt states was suppose to take OCPs with IUD per Dr Oscar La on 10/21 at [facility]: Use OCP's for the next 3 months, then monitor bleeding. Pt states did not start OCPs due to in past making her having nausea and vomiting.   Pt advised to have OV for further evaluation. Pt agreeable. Pt scheduled with Tresa Endo, NP for OV on 11/23 at 230 pm. Pt agreeable to date and time of appt. Pt advised if has severe abd pain/cramping that does not resolve with Ibuprofen, then to be seen at ER or Urgent care. Pt agreeable.  Routing to [place], NP for review Encounter closed.

## 2020-11-16 NOTE — Progress Notes (Deleted)
GYNECOLOGY  VISIT  CC:   ***  HPI: 19 y.o. G0P0000 Single Black or African American female here for vaginal bleeding with iud.     GYNECOLOGIC HISTORY: No LMP recorded. Contraception: iud Menopausal hormone therapy: ***  Patient Active Problem List   Diagnosis Date Noted  . History of PID   . History of chlamydia   . Dysmenorrhea 03/27/2014    Past Medical History:  Diagnosis Date  . Anemia   . Anxiety   . Depression   . Dysmenorrhea   . History of chlamydia   . History of PID   . Migraine without aura   . STD (sexually transmitted disease)    HSV II    No past surgical history on file.  MEDS:   Current Outpatient Medications on File Prior to Visit  Medication Sig Dispense Refill  . cetirizine (ZYRTEC ALLERGY) 10 MG tablet Take 1 tablet (10 mg total) by mouth daily. 30 tablet 0  . ibuprofen (ADVIL) 200 MG tablet Take 200 mg by mouth every 6 (six) hours as needed.    Marland Kitchen levonorgestrel (MIRENA) 20 MCG/24HR IUD 1 each by Intrauterine route once.    . norethindrone-ethinyl estradiol (LOESTRIN FE) 1-20 MG-MCG tablet Take 1 tablet by mouth daily. 84 tablet 0  . pseudoephedrine (SUDAFED) 30 MG tablet Take 1 tablet (30 mg total) by mouth every 8 (eight) hours as needed for congestion. 30 tablet 0  . valACYclovir (VALTREX) 500 MG tablet Take one tablet BID x 3 days as needed. 30 tablet 1   No current facility-administered medications on file prior to visit.    ALLERGIES: Bactrim [sulfamethoxazole-trimethoprim]  Family History  Problem Relation Age of Onset  . Endometriosis Mother   . Sarcoidosis Mother   . Heart disease Father     SH:  ***  Review of Systems  PHYSICAL EXAMINATION:    There were no vitals taken for this visit.    General appearance: alert, cooperative and appears stated age Neck: no adenopathy, supple, symmetrical, trachea midline and thyroid {CHL AMB PHY EX THYROID NORM DEFAULT:[phone]::"normal to inspection and palpation"} CV:  {Exam;  heart brief:31539} Lungs:  {pe lungs ob:314451::"clear to auscultation, no wheezes, rales or rhonchi, symmetric air entry"} Breasts: {Exam; breast:13139::"normal appearance, no masses or tenderness"} Abdomen: soft, non-tender; bowel sounds normal; no masses,  no organomegaly Lymph:  no inguinal LAD noted  Pelvic: External genitalia:  no lesions              Urethra:  normal appearing urethra with no masses, tenderness or lesions              Bartholins and Skenes: normal                 Vagina: normal appearing vagina with normal color and discharge, no lesions              Cervix: {CHL AMB PHY EX CERVIX NORM DEFAULT:[phone]::"no lesions"}              Bimanual Exam:  Uterus:  {CHL AMB PHY EX UTERUS NORM [phone]::"normal size, contour, position, consistency, mobility, non-tender"}              Adnexa: {CHL AMB PHY EX ADNEXA NO MASS DEFAULT:[phone]::"no mass, fullness, tenderness"}              Rectovaginal: {yes no:314532}.  Confirms.              Anus:  normal sphincter tone, no lesions  Chaperone, ***,  CMA, was present for exam.  Assessment: ***  Plan: ***   {NUMBERS; -10-45 JOINT ROM:10287} minutes of total time was spent for this patient encounter, including preparation, face-to-face counseling with the patient and coordination of care, and documentation of the encounter.

## 2020-11-17 ENCOUNTER — Ambulatory Visit: Payer: Self-pay | Admitting: Nurse Practitioner

## 2020-11-17 ENCOUNTER — Encounter: Payer: Self-pay | Admitting: Obstetrics and Gynecology

## 2020-12-10 ENCOUNTER — Other Ambulatory Visit: Payer: Medicaid Other

## 2020-12-31 ENCOUNTER — Telehealth: Payer: Self-pay

## 2020-12-31 NOTE — Telephone Encounter (Signed)
Please schedule her for an appointment. I have openings tomorrow if she would like to come in then.

## 2020-12-31 NOTE — Telephone Encounter (Signed)
I called and spoke with patient.  On 05/29/2019 she had Mirena IUD placed. Since Feb 2021 she had had off and on bleeding episodes. She said the last one she bled for 10 days and the current episode she is on Day 22 of bleeding. She said it ranges from spotting to a regular menstrual flow. She said the IUD has relived her pain that endometriosis was causing. She expressed that she feels this bleeding is not normal and wonders if she should have this IUD removed.

## 2020-12-31 NOTE — Telephone Encounter (Signed)
Patient left voicemail regarding having a cycle for over a month. Patient stated, "I think it might be time to have this IUD taken out."

## 2020-12-31 NOTE — Telephone Encounter (Signed)
Spoke with patient and informed her Dr. Oscar La recommends OV. Appt scheduled for tomorrow.

## 2021-01-01 ENCOUNTER — Other Ambulatory Visit: Payer: Self-pay

## 2021-01-01 ENCOUNTER — Encounter: Payer: Self-pay | Admitting: Obstetrics and Gynecology

## 2021-01-01 ENCOUNTER — Ambulatory Visit (INDEPENDENT_AMBULATORY_CARE_PROVIDER_SITE_OTHER): Payer: Medicaid Other | Admitting: Obstetrics and Gynecology

## 2021-01-01 VITALS — BP 110/60 | HR 88 | Resp 18 | Ht 61.0 in | Wt 119.0 lb

## 2021-01-01 DIAGNOSIS — N921 Excessive and frequent menstruation with irregular cycle: Secondary | ICD-10-CM | POA: Diagnosis not present

## 2021-01-01 DIAGNOSIS — Z975 Presence of (intrauterine) contraceptive device: Secondary | ICD-10-CM | POA: Diagnosis not present

## 2021-01-01 MED ORDER — ETONOGESTREL-ETHINYL ESTRADIOL 0.12-0.015 MG/24HR VA RING: VAGINAL_RING | VAGINAL | 0 refills | Status: DC

## 2021-01-01 NOTE — Patient Instructions (Signed)

## 2021-01-01 NOTE — Progress Notes (Signed)
GYNECOLOGY  VISIT   HPI: 20 y.o.   Single Black or African American Not Hispanic or Latino  female   G0P0000 with Patient's last menstrual period was 12/10/2020.   here for recheck for AUB with IUD. Per patient has been bleeding for 22 days and the previous period in October 2021 lasted for 2 weeks. Some cramps occasionally.  Currently she is changing a regular tampon in 8 hours, this is an improvement. She was using a super tampon and changing it every 3-4 hours.   She has a mirena IUD, placed in 6/20. At the time of her annual exam in 10/21 she was c/o bleeding every other month for the whole month. Her bleeding would go from spotting to moderate flow. Having cramps, but not as bad as prior to the IUD. At that visit she was given a 3 month script of OCP's (loestrin 1/20). She never took the pills secondary to concerns about nausea (previously would have nausea and emesis during her cycle on the pills). At the time she was having the nausea and emesis she was also having severe cramps. Her cramps aren't as bad with the IUD as they were previously.   10/21 Pap: normal with negative GC/CT/trich Hgb was 12.1.   She is with the same partner she was with in October, not using condoms. No STD concerns   GYNECOLOGIC HISTORY: Patient's last menstrual period was 12/10/2020. Contraception:Mirena Menopausal hormone therapy: none        OB History    Gravida  0   Para  0   Term  0   Preterm  0   AB  0   Living  0     SAB  0   IAB  0   Ectopic  0   Multiple  0   Live Births                 Patient Active Problem List   Diagnosis Date Noted  . History of PID   . History of chlamydia   . Dysmenorrhea 03/27/2014    Past Medical History:  Diagnosis Date  . Anemia   . Anxiety   . Depression   . Dysmenorrhea   . History of chlamydia   . History of PID   . Migraine without aura   . STD (sexually transmitted disease)    HSV II    History reviewed. No pertinent  surgical history.  Current Outpatient Medications  Medication Sig Dispense Refill  . cetirizine (ZYRTEC ALLERGY) 10 MG tablet Take 1 tablet (10 mg total) by mouth daily. 30 tablet 0  . ibuprofen (ADVIL) 200 MG tablet Take 200 mg by mouth every 6 (six) hours as needed.    Marland Kitchen levonorgestrel (MIRENA) 20 MCG/24HR IUD 1 each by Intrauterine route once.    . valACYclovir (VALTREX) 500 MG tablet Take one tablet BID x 3 days as needed. (Patient not taking: Reported on 01/01/2021) 30 tablet 1   No current facility-administered medications for this visit.     ALLERGIES: Bactrim [sulfamethoxazole-trimethoprim]  Family History  Problem Relation Age of Onset  . Endometriosis Mother   . Sarcoidosis Mother   . Heart disease Father     Social History   Socioeconomic History  . Marital status: Single    Spouse name: Not on file  . Number of children: Not on file  . Years of education: Not on file  . Highest education level: Not on file  Occupational History  .  Not on file  Tobacco Use  . Smoking status: Current Every Day Smoker    Types: Cigarettes  . Smokeless tobacco: Never Used  Vaping Use  . Vaping Use: Every day  Substance and Sexual Activity  . Alcohol use: No  . Drug use: No  . Sexual activity: Yes    Birth control/protection: I.U.D., Condom  Other Topics Concern  . Not on file  Social History Narrative  . Not on file   Social Determinants of Health   Financial Resource Strain: Not on file  Food Insecurity: Not on file  Transportation Needs: Not on file  Physical Activity: Not on file  Stress: Not on file  Social Connections: Not on file  Intimate Partner Violence: Not on file    Review of Systems  Constitutional: Negative.   HENT: Negative.   Eyes: Negative.   Respiratory: Negative.   Cardiovascular: Negative.   Gastrointestinal: Negative.   Genitourinary:       AUB cramping  Musculoskeletal: Negative.   Skin: Negative.   Neurological: Negative.    Endo/Heme/Allergies: Negative.   Psychiatric/Behavioral: Negative.     PHYSICAL EXAMINATION:    BP 110/60 (BP Location: Right Arm, Patient Position: Sitting, Cuff Size: Normal)   Pulse 88   Resp 18   Ht 5\' 1"  (1.549 m)   Wt 119 lb (54 kg)   LMP 12/10/2020   BMI 22.48 kg/m     General appearance: alert, cooperative and appears stated age  87. Breakthrough bleeding with IUD Recent normal CBC, negative STD testing. Nothing has changed since her visit in 10/21. She never took the OCP's secondary to fear of nausea. She is willing to try the nuvaring for 3 months.  - etonogestrel-ethinyl estradiol (NUVARING) 0.12-0.015 MG/24HR vaginal ring; Insert vaginally and leave in place for 4 consecutive weeks, then remove for 1 week. Then place a new ring  Dispense: 3 each; Refill: 0 -f/u in 3 months -If she continues to have BTB with the IUD may need removal.

## 2021-03-31 ENCOUNTER — Other Ambulatory Visit: Payer: Self-pay

## 2021-03-31 ENCOUNTER — Other Ambulatory Visit: Payer: Self-pay | Admitting: Obstetrics and Gynecology

## 2021-03-31 ENCOUNTER — Ambulatory Visit (INDEPENDENT_AMBULATORY_CARE_PROVIDER_SITE_OTHER): Payer: Medicaid Other | Admitting: Obstetrics and Gynecology

## 2021-03-31 ENCOUNTER — Encounter: Payer: Self-pay | Admitting: Obstetrics and Gynecology

## 2021-03-31 VITALS — BP 110/62 | HR 94 | Ht 61.0 in | Wt 123.0 lb

## 2021-03-31 DIAGNOSIS — N921 Excessive and frequent menstruation with irregular cycle: Secondary | ICD-10-CM

## 2021-03-31 DIAGNOSIS — Z975 Presence of (intrauterine) contraceptive device: Secondary | ICD-10-CM

## 2021-03-31 DIAGNOSIS — N946 Dysmenorrhea, unspecified: Secondary | ICD-10-CM | POA: Diagnosis not present

## 2021-03-31 MED ORDER — ETONOGESTREL-ETHINYL ESTRADIOL 0.12-0.015 MG/24HR VA RING: VAGINAL_RING | VAGINAL | 0 refills | Status: DC

## 2021-03-31 NOTE — Progress Notes (Signed)
GYNECOLOGY  VISIT   HPI: 20 y.o.   Single Black or African American Not Hispanic or Latino  female   G0P0000 with Patient's last menstrual period was 03/07/2021.   here for abnormal bleeding. She states that she has not gotten the nuvaring because of insurance issues. She states that she is still having light bleeding.  She has a mirena IUD, placed in 6/20. In 1/22 she was seen with c/o BTB with the IUD. She had a normal CBC, negative STD testing. She had previously been given a script for OCP's for BTB, she didn't take them secondary to fear of nausea. In 1/22 she was given a script for the nuvaring, she didn't take it secondary to cost.   Her bleeding starts and stops, can stop for 2.5 weeks, then will bleed for most of a month. Occasionally heavy. Typically can leave a super + tampon in 8 hours, one time saturated a super + tampon in 2 hours. She gets cramps, but they are better with the IUD then prior to having the IUD  GYNECOLOGIC HISTORY: Patient's last menstrual period was 03/07/2021. Contraception:IUD  Menopausal hormone therapy: none         OB History    Gravida  0   Para  0   Term  0   Preterm  0   AB  0   Living  0     SAB  0   IAB  0   Ectopic  0   Multiple  0   Live Births                 Patient Active Problem List   Diagnosis Date Noted  . History of PID   . History of chlamydia   . Dysmenorrhea 03/27/2014    Past Medical History:  Diagnosis Date  . Anemia   . Anxiety   . Depression   . Dysmenorrhea   . History of chlamydia   . History of PID   . Migraine without aura   . STD (sexually transmitted disease)    HSV II    No past surgical history on file.  Current Outpatient Medications  Medication Sig Dispense Refill  . ibuprofen (ADVIL) 200 MG tablet Take 200 mg by mouth every 6 (six) hours as needed.    Marland Kitchen levonorgestrel (MIRENA) 20 MCG/24HR IUD 1 each by Intrauterine route once.    . valACYclovir (VALTREX) 500 MG tablet Take one  tablet BID x 3 days as needed. 30 tablet 1  . etonogestrel-ethinyl estradiol (NUVARING) 0.12-0.015 MG/24HR vaginal ring Insert vaginally and leave in place for 4 consecutive weeks, then remove for 1 week. Then place a new ring (Patient not taking: Reported on 03/31/2021) 3 each 0   No current facility-administered medications for this visit.     ALLERGIES: Bactrim [sulfamethoxazole-trimethoprim]  Family History  Problem Relation Age of Onset  . Endometriosis Mother   . Sarcoidosis Mother   . Heart disease Father     Social History   Socioeconomic History  . Marital status: Single    Spouse name: Not on file  . Number of children: Not on file  . Years of education: Not on file  . Highest education level: Not on file  Occupational History  . Not on file  Tobacco Use  . Smoking status: Current Every Day Smoker    Types: Cigarettes  . Smokeless tobacco: Never Used  Vaping Use  . Vaping Use: Every day  Substance and Sexual  Activity  . Alcohol use: No  . Drug use: No  . Sexual activity: Yes    Birth control/protection: I.U.D., Condom  Other Topics Concern  . Not on file  Social History Narrative  . Not on file   Social Determinants of Health   Financial Resource Strain: Not on file  Food Insecurity: Not on file  Transportation Needs: Not on file  Physical Activity: Not on file  Stress: Not on file  Social Connections: Not on file  Intimate Partner Violence: Not on file    Review of Systems  All other systems reviewed and are negative.   PHYSICAL EXAMINATION:    BP 110/62   Pulse 94   Ht 5\' 1"  (1.549 m)   Wt 123 lb (55.8 kg)   LMP 03/07/2021   SpO2 99%   BMI 23.24 kg/m     General appearance: alert, cooperative and appears stated age   Pelvic: External genitalia:  no lesions              Urethra:  normal appearing urethra with no masses, tenderness or lesions              Bartholins and Skenes: normal                 Vagina: normal appearing vagina with  a slight increase in watery, white vaginal d/c (denies symptoms)              Cervix: no cervical motion tenderness, no lesions and IUD string 2-3 cm              Bimanual Exam:  Uterus:  normal size, contour, position, consistency, mobility, non-tender              Adnexa: no mass, fullness, tenderness               Chaperone was present for exam.  1. Breakthrough bleeding with IUD She has had a normal exam, prior negative STD testing and normal CBC. She hasn't taken OCP's or the nuvaring up until this point, she would like to try the nuvaring. If it is too expensive she is willing to try low dose OCP's. No contraindications - etonogestrel-ethinyl estradiol (NUVARING) 0.12-0.015 MG/24HR vaginal ring; Insert vaginally and leave in place for 4 consecutive weeks, then remove for 1 week. Then place a new ring  Dispense: 3 each; Refill: 0 -F/U in 3 months, if not better will further evaluate.   2. Dysmenorrhea Overall tolerable

## 2021-03-31 NOTE — Telephone Encounter (Signed)
Yes, I want her to leave it in for 4 weeks.

## 2021-03-31 NOTE — Telephone Encounter (Signed)
Dr.Jertson pharmacy attached message to Rx stating "Pharmacy comment: Script Clarification:NORMALLY FOR 3 CONSECUTIVE WEEKS... JUST WANT TO DOUBLE CHECK THE INSTRUCTIONS.

## 2021-04-01 NOTE — Telephone Encounter (Signed)
I called pharmacy and left detailed message on voicemail the directions are correct.

## 2021-07-07 ENCOUNTER — Ambulatory Visit: Payer: Medicaid Other | Admitting: Obstetrics and Gynecology

## 2021-07-07 ENCOUNTER — Other Ambulatory Visit: Payer: Self-pay

## 2021-07-07 ENCOUNTER — Telehealth: Payer: Self-pay

## 2021-07-07 ENCOUNTER — Encounter: Payer: Self-pay | Admitting: Obstetrics and Gynecology

## 2021-07-07 VITALS — BP 124/62 | HR 93 | Ht 62.0 in | Wt 126.0 lb

## 2021-07-07 DIAGNOSIS — Z3009 Encounter for other general counseling and advice on contraception: Secondary | ICD-10-CM | POA: Diagnosis not present

## 2021-07-07 DIAGNOSIS — Z113 Encounter for screening for infections with a predominantly sexual mode of transmission: Secondary | ICD-10-CM

## 2021-07-07 DIAGNOSIS — R102 Pelvic and perineal pain: Secondary | ICD-10-CM

## 2021-07-07 DIAGNOSIS — Z975 Presence of (intrauterine) contraceptive device: Secondary | ICD-10-CM | POA: Diagnosis not present

## 2021-07-07 DIAGNOSIS — Z30432 Encounter for removal of intrauterine contraceptive device: Secondary | ICD-10-CM

## 2021-07-07 DIAGNOSIS — N921 Excessive and frequent menstruation with irregular cycle: Secondary | ICD-10-CM | POA: Diagnosis not present

## 2021-07-07 MED ORDER — ETONOGESTREL-ETHINYL ESTRADIOL 0.12-0.015 MG/24HR VA RING: VAGINAL_RING | VAGINAL | 0 refills | Status: DC

## 2021-07-07 MED ORDER — IBUPROFEN 800 MG PO TABS: 800.0000 mg | ORAL_TABLET | Freq: Three times a day (TID) | ORAL | 1 refills | Status: DC | PRN

## 2021-07-07 NOTE — Telephone Encounter (Signed)
Called place to [facility] on [address] to verify that patient was to take Nuvaring for 4 consecutive weeks and then remove for one.   Spoke to [place].

## 2021-07-07 NOTE — Progress Notes (Signed)
GYNECOLOGY  VISIT   HPI: 20 y.o.   Single Black or African American Not Hispanic or Latino  female   G0P0000 with No LMP recorded.   here for continued break through bleeding with th mirena IUD IUD (placed in 6/20). Patient states that she has been bleeding for a month. She is having lower abdominal cramping and lower back discomfort. Patient has not been using the nuvaring because she was told it wasn't ready at the pharmacy.   The patient was seen in 1/22 c/o BTB with the mirena IUD. She had a normal CBC and negative STD testing. She was given a script for the nuvaring but didn't get it secondary to cost. She was seen again In April c/o continued bleeding. At that visit she wanted to try the nuvaring, didn't get it this time because of issues with the pharmacy.   She c/o a couple month h/o intermittent pelvic cramping.  It typically comes in the afternoon and lasts the rest of the day. Occurring at least one time a week. Up to a 7/10 in severity. She has had some lose stools as well.   She is frustrated and just wants the IUD out.   She is sexually active, using condoms sometimes, just one partner.   GYNECOLOGIC HISTORY: No LMP recorded. Contraception:IUD Menopausal hormone therapy: none        OB History     Gravida  0   Para  0   Term  0   Preterm  0   AB  0   Living  0      SAB  0   IAB  0   Ectopic  0   Multiple  0   Live Births                 Patient Active Problem List   Diagnosis Date Noted   History of PID    History of chlamydia    Dysmenorrhea 03/27/2014    Past Medical History:  Diagnosis Date   Anemia    Anxiety    Depression    Dysmenorrhea    History of chlamydia    History of PID    Migraine without aura    STD (sexually transmitted disease)    HSV II    History reviewed. No pertinent surgical history.  Current Outpatient Medications  Medication Sig Dispense Refill   ibuprofen (ADVIL) 200 MG tablet Take 200 mg by mouth  every 6 (six) hours as needed.     levonorgestrel (MIRENA) 20 MCG/24HR IUD 1 each by Intrauterine route once.     No current facility-administered medications for this visit.     ALLERGIES: Bactrim [sulfamethoxazole-trimethoprim]  Family History  Problem Relation Age of Onset   Endometriosis Mother    Sarcoidosis Mother    Heart disease Father     Social History   Socioeconomic History   Marital status: Single    Spouse name: Not on file   Number of children: Not on file   Years of education: Not on file   Highest education level: Not on file  Occupational History   Not on file  Tobacco Use   Smoking status: Every Day    Pack years: 0.00    Types: Cigarettes   Smokeless tobacco: Never  Vaping Use   Vaping Use: Every day  Substance and Sexual Activity   Alcohol use: No   Drug use: No   Sexual activity: Yes    Birth control/protection:  I.U.D., Condom  Other Topics Concern   Not on file  Social History Narrative   Not on file   Social Determinants of Health   Financial Resource Strain: Not on file  Food Insecurity: Not on file  Transportation Needs: Not on file  Physical Activity: Not on file  Stress: Not on file  Social Connections: Not on file  Intimate Partner Violence: Not on file    Review of Systems  Gastrointestinal:  Positive for abdominal pain.  Musculoskeletal:  Positive for back pain.  All other systems reviewed and are negative.  PHYSICAL EXAMINATION:    BP 124/62   Pulse 93   Ht 5\' 2"  (1.575 m)   Wt 126 lb (57.2 kg)   SpO2 99%   BMI 23.05 kg/m     General appearance: alert, cooperative and appears stated age Abdomen: soft, non-tender; non distended, no masses,  no organomegaly  Pelvic: External genitalia:  no lesions              Urethra:  normal appearing urethra with no masses, tenderness or lesions              Bartholins and Skenes: normal                 Vagina: normal appearing vagina with normal color and discharge, no  lesions              Cervix: no cervical motion tenderness, no lesions, and IUD string 2 cm, IUD removed with ringed forceps              Bimanual Exam:  Uterus:   anteverted, mobile, normal sized, minimally tender              Adnexa: no mass, fullness, tenderness               Chaperone was present for exam.  1. Breakthrough bleeding with IUD She would like the IUD removed  2. Encounter for IUD removal IUD removed  3. General counseling and advice on female contraception Discussed options for contraception. She does want to try the nuvaring, no contraindications. The risks have been reviewed - etonogestrel-ethinyl estradiol (NUVARING) 0.12-0.015 MG/24HR vaginal ring; Insert vaginally and leave in place for 4 consecutive weeks, then remove for 1 week.  Dispense: 3 each; Refill: 0  4. Pelvic cramping Minimal uterine tenderness. She has had some loose stools, suspect GI etiology - ibuprofen (ADVIL) 800 MG tablet; Take 1 tablet (800 mg total) by mouth every 8 (eight) hours as needed.  Dispense: 30 tablet; Refill: 1 - SURESWAB CT/NG/T. vaginalis  5. Screening examination for STD (sexually transmitted disease) Declines blood work - SURESWAB CT/NG/T. vaginalis

## 2021-07-07 NOTE — Patient Instructions (Signed)
Etonogestrel; Ethinyl Estradiol Vaginal Ring What is this medication? ETONOGESTREL; ETHINYL ESTRADIOL (et oh noe JES trel; ETH in il es tra DYE ole) prevents ovulation and pregnancy. It belongs to a group of medications called oral contraceptives. It is a combination of the hormones estrogen and progestin. This medicine may be used for other purposes; ask your health care provider or pharmacist if you have questions. COMMON BRAND NAME(S): EluRyng, NuvaRing What should I tell my care team before I take this medication? They need to know if you have any of these conditions: Abnormal vaginal bleeding Blood vessel disease or blood clots Breast, cervical, endometrial, ovarian, liver, or uterine cancer Diabetes (high blood sugar) Gallbladder disease Having surgery Heart disease or recent heart attack High blood pressure High cholesterol or triglycerides History of irregular heartbeat or heart valve problems Kidney disease Liver disease Migraine headaches Protein C/S deficiency Recently had a baby, miscarriage, or abortion Stroke Systemic lupus erythematosus (SLE) Tobacco smoker An unusual or allergic reaction to estrogens, progestins, other medications, foods, dyes, or preservatives Pregnant or trying to get pregnant Breast-feeding How should I use this medication? Insert the ring into your vagina as directed. Follow the directions on the prescription label. The ring will remain place for 3 weeks and is then removed for a 1-week break. A new ring is inserted 1 week after the last ring was removed, on the same day of the week. Check often to make sure the ring is still in place. If the ring was out of the vagina for an unknown amount of time, you may not be protected from pregnancy. Perform a pregnancy test and call your care team. Do not use more often than directed. A patient package insert for the product will be given with each prescription and refill. Read this sheet carefully each time.  The sheet may change frequently. Contact your care team regarding the use of this medication in children. Special care may be needed. Overdosage: If you think you have taken too much of this medicine contact a poison control center or emergency room at once. NOTE: This medicine is only for you. Do not share this medicine with others. What if I miss a dose? You will need to use the ring exactly as directed. It is very important to follow the schedule every cycle. If you do not use the ring as directed, you may not be protected from pregnancy. If the ring should slip out, is lost, or if you leave it in longer or shorter than you should, contact your care team for advice. What may interact with this medication? Do not take this medication with the following: Dasabuvir; ombitasvir; paritaprevir; ritonavir Ombitasvir; paritaprevir; ritonavir Vaginal lubricants or other vaginal products that are oil-based or silicone-based This medication may also interact with the following: Acetaminophen Antibiotics or medications for infections, especially rifampin, rifabutin, rifapentine, and griseofulvin, and possibly penicillins or tetracyclines Aprepitant or fosaprepitant Armodafinil Ascorbic acid (vitamin C) Barbiturate medications, such as phenobarbital or primidone Bosentan Certain antiviral medications for hepatitis, HIV or AIDS Certain medications for cancer treatment Certain medications for seizures like carbamazepine, clobazam, felbamate, lamotrigine, oxcarbazepine, phenytoin, rufinamide, topiramate Certain medications for treating high cholesterol Cyclosporine Dantrolene Elagolix Flibanserin Grapefruit juice Lesinurad Medications for diabetes Medications to treat fungal infections, such as griseofulvin, miconazole, fluconazole, ketoconazole, itraconazole, posaconazole or voriconazole Mifepristone Mitotane Modafinil Morphine Mycophenolate St. John's wort Tamoxifen Temazepam Theophylline  or aminophylline Thyroid hormones Tizanidine Tranexamic acid Ulipristal Warfarin This list may not describe all possible interactions. Give your health   medicines, herbs, non-prescription drugs, or dietary supplements you use. Also tell them if you smoke, drink alcohol, or use illegaldrugs. Some items may interact with your medicine. What should I watch for while using this medication? Visit your care team for regular checks on your progress. You will need aregular breast and pelvic exam and Pap smear while on this medication. Check with your care team to see if you need an additional method of contraception during the first cycle that you use this ring. Female condoms (made with natural rubber latex, polyisoprene, and polyurethane) and spermicides may be used. Do not use a diaphragm, cervical cap, or a female condom, as the ringcan interfere with these birth control methods and their proper placement. If you have any reason to think you are pregnant, stop using this medicationright away and contact your care team. If you are using this medication for hormone related problems, it may takeseveral cycles of use to see improvement in your condition. Smoking increases the risk of getting a blood clot or having a stroke while you are using hormonal birth control, especially if you are older than 22 yearsold. You are strongly advised not to smoke. Some women are prone to getting dark patches on the skin of the face (cholasma). Your risk of getting chloasma with this medication is higher if you had chloasma during a pregnancy. Keep out of the sun. If you cannot avoid being in the sun, wear protective clothing and use sunscreen. Do not use sun lamps ortanning beds/booths. This medication can make your body retain fluid, making your fingers, hands, or ankles swell. Your blood pressure can go up. Contact your care team if you feelyou are retaining fluid. If you are going to have  elective surgery, you may need to stop using thismedication before the surgery. Consult your care team for advice. This medication does not protect you against HIV infection (AIDS) or any othersexually transmitted infections. What side effects may I notice from receiving this medication? Side effects that you should report to your care team as soon as possible: Allergic reactions-skin rash, itching, hives, swelling of the face, lips, tongue, or throat Blood clot-pain, swelling, or warmth in the leg, shortness of breath, chest pain Gallbladder problems-severe stomach pain, nausea, vomiting, fever Increase in blood pressure Liver injury-right upper belly pain, loss of appetite, nausea, light-colored stool, dark yellow or brown urine, yellowing skin or eyes, unusual weakness or fatigue New or worsening migraines or headaches Stroke-sudden numbness or weakness of the face, arm, or leg, trouble speaking, confusion, trouble walking, loss of balance or coordination, dizziness, severe headache, change in vision Toxic shock syndrome-fever, headache, general discomfort and fatigue, vomiting, diarrhea, rash or peeling of the skin over hands or feet Unusual vaginal discharge, itching, or odor Vaginal pain, irritation, or sores Worsening mood, feelings of depression Side effects that usually do not require medical attention (report to your careteam if they continue or are bothersome): Breast pain or tenderness Dark patches of skin on the face or other sun-exposed areas Irregular menstrual cycles or spotting Nausea Weight gain This list may not describe all possible side effects. Call your doctor for medical advice about side effects. You may report side effects to FDA at1-800-FDA-1088. Where should I keep my medication? Keep out of the reach of children and pets. Store unopened medication for up to 4 months at room temperature at 15 and 30 degrees C (59 and 86 degrees F). Protect from light. Do not store  above 30 degrees C (86  degrees F). Throw away any unused medication 4 months after the dispense date or the expiration date, whichever comes first. A ring may only be used for 1 cycle (1 month). After the 3-week cycle, a used ring is removed and should be placed in the re-closable foil pouch and discarded in the trash outof reach of children and pets. Do NOT flush down the toilet. NOTE: This sheet is a summary. It may not cover all possible information. If you have questions about this medicine, talk to your doctor, pharmacist, orhealth care provider.  2022 Elsevier/Gold Standard (2021-01-06 15:11:57)

## 2021-07-08 LAB — SURESWAB CT/NG/T. VAGINALIS
C. trachomatis RNA, TMA: NOT DETECTED
N. gonorrhoeae RNA, TMA: NOT DETECTED
Trichomonas vaginalis RNA: NOT DETECTED

## 2021-10-04 ENCOUNTER — Other Ambulatory Visit: Payer: Self-pay | Admitting: Obstetrics and Gynecology

## 2021-10-04 DIAGNOSIS — Z3009 Encounter for other general counseling and advice on contraception: Secondary | ICD-10-CM

## 2021-10-04 NOTE — Telephone Encounter (Signed)
Annual exam 10/21/21

## 2021-10-11 ENCOUNTER — Ambulatory Visit: Payer: Medicaid Other | Admitting: Obstetrics and Gynecology

## 2021-10-21 ENCOUNTER — Ambulatory Visit: Payer: Medicaid Other | Admitting: Obstetrics and Gynecology

## 2021-11-05 ENCOUNTER — Ambulatory Visit: Payer: Medicaid Other | Admitting: Obstetrics and Gynecology

## 2024-09-04 LAB — OB RESULTS CONSOLE ABO/RH: RH Type: POSITIVE

## 2024-09-04 LAB — OB RESULTS CONSOLE PLATELET COUNT: Platelets: 310

## 2024-09-04 LAB — OB RESULTS CONSOLE HIV ANTIBODY (ROUTINE TESTING): HIV: NONREACTIVE

## 2024-09-04 LAB — OB RESULTS CONSOLE ANTIBODY SCREEN: Antibody Screen: NEGATIVE

## 2024-09-04 LAB — OB RESULTS CONSOLE HEPATITIS B SURFACE ANTIGEN: Hepatitis B Surface Ag: NEGATIVE

## 2024-09-04 LAB — OB RESULTS CONSOLE RPR: RPR: NONREACTIVE

## 2024-09-04 LAB — OB RESULTS CONSOLE HGB/HCT, BLOOD: Hemoglobin: 11.5

## 2024-09-04 LAB — HEPATITIS C ANTIBODY: HCV Ab: NEGATIVE

## 2024-09-04 LAB — OB RESULTS CONSOLE RUBELLA ANTIBODY, IGM: Rubella: IMMUNE

## 2024-10-09 ENCOUNTER — Encounter: Payer: Self-pay | Admitting: Family Medicine

## 2024-10-24 ENCOUNTER — Telehealth (INDEPENDENT_AMBULATORY_CARE_PROVIDER_SITE_OTHER)

## 2024-10-24 DIAGNOSIS — Z3A17 17 weeks gestation of pregnancy: Secondary | ICD-10-CM

## 2024-10-24 DIAGNOSIS — Z3400 Encounter for supervision of normal first pregnancy, unspecified trimester: Secondary | ICD-10-CM | POA: Insufficient documentation

## 2024-10-24 DIAGNOSIS — Z3402 Encounter for supervision of normal first pregnancy, second trimester: Secondary | ICD-10-CM | POA: Diagnosis not present

## 2024-10-24 MED ORDER — BLOOD PRESSURE KIT DEVI: 1.0000 | Freq: Once | 0 refills | Status: DC

## 2024-10-24 MED ORDER — BLOOD PRESSURE KIT DEVI: 1.0000 | Freq: Once | 0 refills | Status: AC

## 2024-10-24 NOTE — Progress Notes (Signed)
 New OB Intake  I connected with Mercedes Guzman  on 10/24/24 at  8:15 AM EDT by MyChart Video Visit and verified that I am speaking with the correct person using two identifiers. Nurse is located at [facility] and pt is located at home.  I discussed the limitations, risks, security and privacy concerns of performing an evaluation and management service by telephone and the availability of in person appointments. I also discussed with the patient that there may be a patient responsible charge related to this service. The patient expressed understanding and agreed to proceed.  I explained I am completing New OB Intake today. We discussed EDD of 03/31/2025 based on LMP of 06/24/24. Pt is G1P0000. I reviewed her allergies, medications and Medical/Surgical/OB history.    Patient Active Problem List   Diagnosis Date Noted   Encounter for supervision of low-risk first pregnancy, antepartum 10/24/2024     Concerns addressed today Pt requested note in her chart that she doesn't want to know gender.   Delivery Plans Plans to deliver at [facility]. Discussed the nature of our practice with multiple providers including residents and students as well as female and female providers. Due to the size of the practice, the delivering provider may not be the same as those providing prenatal care.   Patient is not interested in water birth.  MyChart/Babyscripts MyChart access verified. I explained pt will have some visits in office and some virtually. Babyscripts instructions given and order placed.   Blood Pressure Cuff/Weight Scale Blood pressure cuff ordered for patient to pick-up from [employer]. Explained after first prenatal appt pt will check weekly and document in Babyscripts. Patient does have weight scale.  Anatomy US  Explained first scheduled US  will be around 19 weeks. Anatomy US  scheduled for 12/16/24 at 08:00am.  Is patient a Centering Pregnancy candidate?  Declined Declined due to Group  setting  Is patient a Mom+Baby Combined Care candidate?  Accepted   If accepted, confirm patient does not intend to move from the area for at least 12 months, then notify Mom+Baby staff  Is patient a candidate for Babyscripts Optimization? Yes, patient declined.   First visit review I reviewed new OB appt with patient. Explained pt will be seen by Wallace, PA at first visit 11/07/24 at 2:35pm. Discussed Natera genetic screening with patient. Pt states she had US  and genetic testing in [place] , see media.   Last Pap Diagnosis  Date Value Ref Range Status  10/15/2020   Final   - Negative for intraepithelial lesion or malignancy (NILM)    Mercedes LITTIE Burows, RN 10/24/2024  9:14 AM

## 2024-10-24 NOTE — Patient Instructions (Signed)

## 2024-11-05 ENCOUNTER — Encounter: Admitting: Family Medicine

## 2024-11-07 ENCOUNTER — Other Ambulatory Visit (HOSPITAL_COMMUNITY)
Admission: RE | Admit: 2024-11-07 | Discharge: 2024-11-07 | Disposition: A | Source: Ambulatory Visit | Attending: Family Medicine | Admitting: Family Medicine

## 2024-11-07 ENCOUNTER — Other Ambulatory Visit: Payer: Self-pay

## 2024-11-07 ENCOUNTER — Ambulatory Visit: Admitting: Family Medicine

## 2024-11-07 ENCOUNTER — Encounter: Payer: Self-pay | Admitting: Family Medicine

## 2024-11-07 VITALS — BP 106/72 | HR 82 | Wt 145.2 lb

## 2024-11-07 DIAGNOSIS — Z3400 Encounter for supervision of normal first pregnancy, unspecified trimester: Secondary | ICD-10-CM

## 2024-11-07 DIAGNOSIS — Z3A19 19 weeks gestation of pregnancy: Secondary | ICD-10-CM | POA: Diagnosis not present

## 2024-11-07 DIAGNOSIS — Z3402 Encounter for supervision of normal first pregnancy, second trimester: Secondary | ICD-10-CM | POA: Diagnosis not present

## 2024-11-07 NOTE — Progress Notes (Signed)
 INITIAL PRENATAL VISIT  History:  Mercedes Guzman is a 23 y.o. G1P0000 at 109w3d by LMP being seen today for her first obstetrical visit.  Her obstetrical history is significant for none. Patient does intend to breast feed. Pregnancy history fully reviewed.  Patient reports no complaints.  HISTORY: OB History  Gravida Para Term Preterm AB Living  1 0 0 0 0 0  SAB IAB Ectopic Multiple Live Births  0 0 0 0 0    # Outcome Date GA Lbr Len/2nd Weight Sex Type Anes PTL Lv  1 Current             Last pap smear was done 10/15/2020 and was normal  Past Medical History:  Diagnosis Date   Anemia    Anxiety    Depression    Dysmenorrhea    Dysmenorrhea 03/27/2014   History of chlamydia    History of PID    History of PID    Migraine without aura    STD (sexually transmitted disease)    HSV II   History reviewed. No pertinent surgical history. Family History  Problem Relation Age of Onset   Endometriosis Mother    Sarcoidosis Mother    Anemia Mother    Heart disease Father    Social History   Tobacco Use   Smoking status: Former    Types: Cigarettes, E-cigarettes   Smokeless tobacco: Never  Vaping Use   Vaping status: Every Day  Substance Use Topics   Alcohol use: No   Drug use: No   Allergies  Allergen Reactions   Bactrim  [Sulfamethoxazole -Trimethoprim ] Anaphylaxis    Chest is tight   Current Outpatient Medications on File Prior to Visit  Medication Sig Dispense Refill   Prenatal Vit-Fe Fumarate-FA (PRENATAL MULTIVITAMIN) TABS tablet Take 2 tablets by mouth daily at 12 noon.     No current facility-administered medications on file prior to visit.    Review of Systems Pertinent items noted in HPI and remainder of comprehensive ROS otherwise negative.  Indications for ASA therapy (per UpToDate) One of the following: (Needs 162 mg daily) Previous pregnancy with preeclampsia, especially early onset and with an adverse outcome No Chronic hypertension No Type  1 or 2 diabetes mellitus No Multifetal gestation No Chronic kidney disease No Autoimmune disease (antiphospholipid syndrome, systemic lupus erythematosus) No Two or more of the following: (Can do 81 mg daily) Nulliparity Yes Obesity (body mass index >30 kg/m2) No Family history of preeclampsia in mother or sister No Age >=35 years No Sociodemographic characteristics (African American race, low socioeconomic level) Yes Personal risk factors (eg, previous pregnancy with low birth weight or small for gestational age infant, previous adverse pregnancy outcome [eg, stillbirth], interval >10 years between pregnancies) No In vitro conception No  Physical Exam:   Vitals:   11/07/24 1446  BP: 106/72  Pulse: 82  Weight: 65.9 kg   Fetal Heart Rate (bpm): 155   General: well-developed, well-nourished female in no acute distress  Breasts:  Deferred  Skin: normal coloration and turgor, no rashes  Neurologic: oriented, normal, negative, normal mood  Extremities: normal strength, tone, and muscle mass, ROM of all joints is normal  HEENT PERRLA, extraocular movement intact and sclera clear, anicteric  Neck supple and no masses  Cardiovascular: regular rate and rhythm  Respiratory:  no respiratory distress, normal breath sounds  Abdomen: soft, non-tender; bowel sounds normal; no masses,  no organomegaly  Pelvic: Deferred   Assessment:  Pregnancy: G1P0000 Patient Active Problem List  Diagnosis Date Noted   Encounter for supervision of low-risk first pregnancy, antepartum 10/24/2024    Plan:  1. Encounter for supervision of low-risk first pregnancy, antepartum (Primary) - Doing well, feeling flutters -Discussed indications for baby aspirin - GC/Chlamydia probe amp ([place])not at [facility] - HgB A1c - AFP, Serum, Open Spina Bifida  2. [redacted] weeks gestation of pregnancy - Routine OB care  -AFP drawn today    Initial labs drawn. Continue prenatal vitamins. Problem list reviewed and  updated. Genetic Screening discussed, already completed early in pregnancy in [place]: Completed. Ultrasound discussed; fetal anatomic survey: scheduled. Anticipatory guidance about prenatal visits given including labs, ultrasounds, and testing. Weight gain recommendations per IOM guidelines reviewed: underweight/BMI 18.5 or less > 28 - 40 lbs; normal weight/BMI 18.5 - 24.9 > 25 - 35 lbs; overweight/BMI 25 - 29.9 > 15 - 25 lbs; obese/BMI 30 or more > 11 - 20 lbs. Discussed usage of the Babyscripts app for more information about pregnancy, and to track blood pressures. Also discussed usage of virtual visits as additional source of managing and completing prenatal visits.  Patient was encouraged to use MyChart to review results, send requests, and have questions addressed.   The nature of [facility]Faculty Practice with multiple MDs and Advanced Practice Providers was explained to patient; also emphasized that residents, students are part of our team. Routine obstetric precautions reviewed. Encouraged to seek out care at our office or emergency room [facility] preferred) for urgent and/or emergent concerns. Return in about 4 weeks (around 12/05/2024) for ROB, Dyad patient.     Derrek JINNY Freund, NP [facility] [employer], [facility]

## 2024-11-07 NOTE — Patient Instructions (Signed)

## 2024-11-08 LAB — GC/CHLAMYDIA PROBE AMP (~~LOC~~) NOT AT ARMC
Chlamydia: NEGATIVE
Comment: NEGATIVE
Comment: NORMAL
Neisseria Gonorrhea: NEGATIVE

## 2024-11-09 ENCOUNTER — Ambulatory Visit: Payer: Self-pay | Admitting: Family Medicine

## 2024-11-09 DIAGNOSIS — Z3400 Encounter for supervision of normal first pregnancy, unspecified trimester: Secondary | ICD-10-CM

## 2024-11-09 LAB — AFP, SERUM, OPEN SPINA BIFIDA
AFP MoM: 1.18
AFP Value: 67.2 ng/mL
Gest. Age on Collection Date: 19.3 wk
Maternal Age At EDD: 25.8 a
OSBR Risk 1 IN: 10000
Test Results:: NEGATIVE
Weight: 145 [lb_av]

## 2024-11-09 LAB — HEMOGLOBIN A1C
Est. average glucose Bld gHb Est-mCnc: 105 mg/dL
Hgb A1c MFr Bld: 5.3 % (ref 4.8–5.6)

## 2024-11-11 LAB — HEMOGLOBIN: Hemoglobin: 11.3

## 2024-11-27 ENCOUNTER — Telehealth: Payer: Self-pay

## 2024-11-27 NOTE — Telephone Encounter (Signed)
 Received call from patient on [facility] line regarding vaginal discharge visible after a shower. Patient denies recent intercourse and advised to proceed to [facility]U for an evaluation if it continues. Dr.Newton informed verbally.Patient expresses understanding with no further questions at this time.

## 2024-12-03 ENCOUNTER — Ambulatory Visit

## 2024-12-03 ENCOUNTER — Other Ambulatory Visit: Payer: Self-pay

## 2024-12-03 VITALS — BP 108/72 | HR 94 | Wt 148.9 lb

## 2024-12-03 DIAGNOSIS — Z3A23 23 weeks gestation of pregnancy: Secondary | ICD-10-CM | POA: Diagnosis not present

## 2024-12-03 DIAGNOSIS — O26842 Uterine size-date discrepancy, second trimester: Secondary | ICD-10-CM | POA: Diagnosis not present

## 2024-12-03 DIAGNOSIS — Z3400 Encounter for supervision of normal first pregnancy, unspecified trimester: Secondary | ICD-10-CM

## 2024-12-03 NOTE — Progress Notes (Signed)
   PRENATAL VISIT NOTE Subjective:  Mercedes Guzman is a 23 y.o. G1P0000 at [redacted]w[redacted]d being seen today for ongoing prenatal care.  She is currently monitored for the following issues for this low-risk pregnancy and  Patient Active Problem List   Diagnosis Date Noted   Encounter for supervision of low-risk first pregnancy, antepartum 10/24/2024    Patient reports no complaints.   Contractions: Not present.  Vag. Bleeding: None.   Movement: Present.  Denies leaking of fluid.   The following portions of the patient's history were reviewed and updated as appropriate: allergies, current medications, past family history, past medical history, past social history, past surgical history and problem list. Problem list updated.  Objective:   Vitals:   12/03/24 1631  BP: 108/72  Pulse: 94  Weight: 148 lb 14.4 oz (67.5 kg)    Fetal Status: Fetal Heart Rate (bpm): 150 Fundal Height: 27 cm Movement: Present     General:  Alert, oriented and cooperative. Patient is in no acute distress.  Skin: Skin is warm and dry. No rash noted.   Cardiovascular: Normal heart rate noted  Respiratory: Normal respiratory effort, no problems with respiration noted  Abdomen: Soft, gravid, appropriate for gestational age. Pain/Pressure: Absent     Pelvic: Vag. Bleeding: None     Cervical exam deferred        Extremities: Normal range of motion.  Edema: None  Mental Status: Normal mood and affect. Normal behavior. Normal judgment and thought content.   Urinalysis:      Assessment and Plan:  Mercedes Guzman is 23 y.o. G1P0000 at [redacted]w[redacted]d, 03/31/2025, Alternate EDD Entry Assessment & Plan Encounter for supervision of low-risk first pregnancy, antepartum BP, FHT appropriate.     Uterine size-date discrepancy in second trimester S>D today. Already has US  scheduled 12/22 for anatomic survey. Dating established by IOB in [place].      Preterm labor symptoms and general obstetric precautions including but not limited to  vaginal bleeding, contractions, leaking of fluid and fetal movement were reviewed in detail with the patient. Please refer to After Visit Summary for other counseling recommendations.   [name], DO FM-OB Fellow [facility]  Future Appointments  Date Time Provider Department Center  12/16/2024  8:00 AM [facility] PROVIDER 1 [facility]  12/16/2024  8:30 AM [facility] US1 [facility]  01/02/2025  8:15 AM Lola Donnice HERO, MD [facility]  01/02/2025  9:20 AM [facility] LAB [facility]  01/16/2025  3:15 PM Lola Donnice HERO, MD [facility]  01/29/2025  1:35 PM Ilean, Norleen GAILS, MD [facility]

## 2024-12-03 NOTE — Assessment & Plan Note (Addendum)
 BP, FHT appropriate.

## 2024-12-04 ENCOUNTER — Encounter: Admitting: Family Medicine

## 2024-12-16 ENCOUNTER — Other Ambulatory Visit

## 2024-12-16 ENCOUNTER — Ambulatory Visit: Attending: Obstetrics and Gynecology | Admitting: Maternal & Fetal Medicine

## 2024-12-16 VITALS — BP 108/63 | HR 96

## 2024-12-16 DIAGNOSIS — Z3A25 25 weeks gestation of pregnancy: Secondary | ICD-10-CM | POA: Insufficient documentation

## 2024-12-16 DIAGNOSIS — Z363 Encounter for antenatal screening for malformations: Secondary | ICD-10-CM | POA: Diagnosis present

## 2024-12-16 DIAGNOSIS — Z3400 Encounter for supervision of normal first pregnancy, unspecified trimester: Secondary | ICD-10-CM

## 2024-12-16 DIAGNOSIS — Z3482 Encounter for supervision of other normal pregnancy, second trimester: Secondary | ICD-10-CM | POA: Diagnosis not present

## 2024-12-16 NOTE — Progress Notes (Signed)
" ° °  Patient information  Patient Name: Mercedes Guzman  Patient MRN:   [id number]  Referring practice: MFM Referring Provider: [facility])  Problem List   Patient Active Problem List   Diagnosis Date Noted   Encounter for supervision of low-risk first pregnancy, antepartum 10/24/2024    Maternal Fetal Medicine Consult [facility] DULING is a 23 y.o. G1P0000 at [redacted]w[redacted]d here for ultrasound and consultation. She had low risk aneuploidy screening of a her fetus.  She does not want to know the gender.  The patient and her partner report that neither of them were born with any significant medical history or genetic defects.  She reports that her pregnancy has been going well and has received prenatal care in [place]  but moved home to be closer to family because of the pregnancy.  I discussed that the ultrasound appears normal today but some of the fetal anatomy was limited due to position and she will return in 5 weeks to complete the anatomy  Recommendations - Follow-up ultrasound in 5 weeks  30 minutes of time was spent reviewing the patient's chart including labs, imaging and documentation.  At least 50% of this time was spent with direct patient care discussing the diagnosis, management and prognosis of her care.  Review of Systems: A review of systems was performed and was negative except per HPI   Past Obstetrical History:  OB History  Gravida Para Term Preterm AB Living  1 0 0 0 0 0  SAB IAB Ectopic Multiple Live Births  0 0 0 0 0    # Outcome Date GA Lbr Len/2nd Weight Sex Type Anes PTL Lv  1 Current              Past Medical History:  Past Medical History:  Diagnosis Date   Anemia    Anxiety    Depression    Dysmenorrhea    Dysmenorrhea 03/27/2014   History of chlamydia    History of PID    History of PID    Migraine without aura    STD (sexually transmitted disease)    HSV II     Past Surgical History:    Past Surgical History:   Procedure Laterality Date   NO PAST SURGERIES       Home Medications:   Medications Ordered Prior to Encounter[1]    Allergies:   Allergies[2]   Physical Exam:   Vitals:   12/16/24 0816  BP: 108/63  Pulse: 96   Sitting comfortably on the sonogram table Nonlabored breathing Normal rate and rhythm Abdomen is nontender  Thank you for the opportunity to be involved with this patient's care. Please let us  know if we can be of any further assistance.   Delora Smaller MFM, [place]   12/16/2024  8:37 AM      [1]  Current Outpatient Medications on File Prior to Visit  Medication Sig Dispense Refill   Prenatal Vit-Fe Fumarate-FA (PRENATAL MULTIVITAMIN) TABS tablet Take 2 tablets by mouth daily at 12 noon.     No current facility-administered medications on file prior to visit.  [2]  Allergies Allergen Reactions   Bactrim  [Sulfamethoxazole -Trimethoprim ] Anaphylaxis    Chest is tight   "

## 2024-12-17 ENCOUNTER — Ambulatory Visit: Payer: Self-pay | Admitting: Family Medicine

## 2024-12-31 DIAGNOSIS — Z3400 Encounter for supervision of normal first pregnancy, unspecified trimester: Secondary | ICD-10-CM

## 2025-01-02 ENCOUNTER — Other Ambulatory Visit

## 2025-01-02 ENCOUNTER — Ambulatory Visit (INDEPENDENT_AMBULATORY_CARE_PROVIDER_SITE_OTHER): Admitting: Family Medicine

## 2025-01-02 ENCOUNTER — Encounter: Payer: Self-pay | Admitting: Family Medicine

## 2025-01-02 VITALS — BP 113/72 | HR 101 | Wt 154.3 lb

## 2025-01-02 DIAGNOSIS — Z3400 Encounter for supervision of normal first pregnancy, unspecified trimester: Secondary | ICD-10-CM

## 2025-01-02 NOTE — Patient Instructions (Signed)

## 2025-01-02 NOTE — Progress Notes (Signed)
" ° °  Subjective:  Mercedes Guzman is a 24 y.o. G1P0000 at [redacted]w[redacted]d being seen today for ongoing prenatal care.  She is currently monitored for the following issues for this low-risk pregnancy and has Encounter for supervision of low-risk first pregnancy, antepartum on their problem list.  Patient reports no complaints.  Contractions: Not present. Vag. Bleeding: None.  Movement: Present. Denies leaking of fluid.   The following portions of the patient's history were reviewed and updated as appropriate: allergies, current medications, past family history, past medical history, past social history, past surgical history and problem list. Problem list updated.  Objective:   Vitals:   01/02/25 0928  BP: 113/72  Pulse: (!) 101  Weight: 154 lb 4.8 oz (70 kg)    Fetal Status: Fetal Heart Rate (bpm): 153   Movement: Present     General:  Alert, oriented and cooperative. Patient is in no acute distress.  Skin: Skin is warm and dry. No rash noted.   Cardiovascular: Normal heart rate noted  Respiratory: Normal respiratory effort, no problems with respiration noted  Abdomen: Soft, gravid, appropriate for gestational age. Pain/Pressure: Absent     Pelvic: Vag. Bleeding: None     Cervical exam deferred        Extremities: Normal range of motion.  Edema: None  Mental Status: Normal mood and affect. Normal behavior. Normal judgment and thought content.   Urinalysis:      Assessment and Plan:  Pregnancy: G1P0000 at [redacted]w[redacted]d  1. Encounter for supervision of low-risk first pregnancy, antepartum (Primary) BP and FHR normal Third trimester labs today Offered TDAP and flu, declined both Discussed contraception, had IUD for a while but didn't like it, doesn't want nexplanon  Encouraged to consider options and have a plan Would like to see patient navigator, will connect  Preterm labor symptoms and general obstetric precautions including but not limited to vaginal bleeding, contractions, leaking of fluid and  fetal movement were reviewed in detail with the patient. Please refer to After Visit Summary for other counseling recommendations.  Return in 2 weeks (on 01/16/2025) for Dyad patient, ob visit.   Lola Donnice HERO, MD "

## 2025-01-03 ENCOUNTER — Ambulatory Visit: Payer: Self-pay | Admitting: Family Medicine

## 2025-01-03 DIAGNOSIS — O99013 Anemia complicating pregnancy, third trimester: Secondary | ICD-10-CM | POA: Insufficient documentation

## 2025-01-03 DIAGNOSIS — Z3400 Encounter for supervision of normal first pregnancy, unspecified trimester: Secondary | ICD-10-CM

## 2025-01-03 DIAGNOSIS — O24410 Gestational diabetes mellitus in pregnancy, diet controlled: Secondary | ICD-10-CM | POA: Insufficient documentation

## 2025-01-03 LAB — SYPHILIS: RPR W/REFLEX TO RPR TITER AND TREPONEMAL ANTIBODIES, TRADITIONAL SCREENING AND DIAGNOSIS ALGORITHM: RPR Ser Ql: NONREACTIVE

## 2025-01-03 LAB — CBC
Hematocrit: 30.4 % — ABNORMAL LOW (ref 34.0–46.6)
Hemoglobin: 10 g/dL — ABNORMAL LOW (ref 11.1–15.9)
MCH: 28.9 pg (ref 26.6–33.0)
MCHC: 32.9 g/dL (ref 31.5–35.7)
MCV: 88 fL (ref 79–97)
Platelets: 304 x10E3/uL (ref 150–450)
RBC: 3.46 x10E6/uL — ABNORMAL LOW (ref 3.77–5.28)
RDW: 13.3 % (ref 11.7–15.4)
WBC: 8.4 x10E3/uL (ref 3.4–10.8)

## 2025-01-03 LAB — GLUCOSE TOLERANCE, 2 HOURS W/ 1HR
Glucose, 1 hour: 198 mg/dL — ABNORMAL HIGH (ref 70–179)
Glucose, 2 hour: 176 mg/dL — ABNORMAL HIGH (ref 70–152)
Glucose, Fasting: 97 mg/dL — ABNORMAL HIGH (ref 70–91)

## 2025-01-03 LAB — HIV ANTIBODY (ROUTINE TESTING W REFLEX): HIV Screen 4th Generation wRfx: NONREACTIVE

## 2025-01-03 MED ORDER — FERROUS SULFATE 325 (65 FE) MG PO TBEC: 325.0000 mg | DELAYED_RELEASE_TABLET | ORAL | 2 refills | Status: AC

## 2025-01-07 ENCOUNTER — Other Ambulatory Visit: Payer: Self-pay

## 2025-01-07 ENCOUNTER — Other Ambulatory Visit: Payer: Self-pay | Admitting: Family Medicine

## 2025-01-07 DIAGNOSIS — O24410 Gestational diabetes mellitus in pregnancy, diet controlled: Secondary | ICD-10-CM

## 2025-01-07 MED ORDER — ACCU-CHEK GUIDE W/DEVICE KIT: 1.0000 | PACK | Freq: Four times a day (QID) | 0 refills | Status: AC

## 2025-01-07 MED ORDER — ACCU-CHEK SOFTCLIX LANCETS MISC: 12 refills | Status: AC

## 2025-01-07 MED ORDER — ACCU-CHEK GUIDE TEST VI STRP: ORAL_STRIP | 12 refills | Status: AC

## 2025-01-08 NOTE — Progress Notes (Signed)
 " Patient was seen for Gestational Diabetes on 01/16/2025  Start time 1400 and End time 1501   Estimated due date: 03/31/2025; [redacted]w[redacted]d  Clinical: Medications: Current Medications[1]  Medical History:  Past Medical History:  Diagnosis Date   Anemia    Anxiety    Depression    Dysmenorrhea    Dysmenorrhea 03/27/2014   History of chlamydia    History of PID    History of PID    Migraine without aura    STD (sexually transmitted disease)    HSV II    Labs: OGTT fasting 97, 1 hour 198, 2 hour 176 on 01/02/2025 Lab Results  Component Value Date   HGBA1C 5.3 11/07/2024   Dietary and Lifestyle History:  Patient is here today with her father of her child, Lynwood. Patient reports Lynwood does the shopping and Pt does the cooking. Pt reports eating out four times weekly. Pt reports making the following changes including adding water to her sugar sweetened beverages, increasing vegetables and eating out less often. Pt reports she avoid pork and pork containing products. Pt reports she is lactose intolerant and selects lactacid milk. Pt states she has had increased hunger in the middle of the night for the past couple of weeks. All Pt's questions were answered during this encounter.   Physical Activity: take the stairs twice daily Stress: 3-4 of 10 / self care includes: breathing, shower, sleep Sleep: good  24 hr Recall:  First Meal: 2 eggs, 2  pancakes with SF syrup, 2 turkey sausage, water or lactaid with special K with almonds Snack:  chip or popcorn or grapes  Second meal: [employer]: steak and garlic nachos nachos, water  Snack:  2 slices honey wheat bread, peanut butter and jelly, water or water mixed with minute maid or berries  Third meal:  baked chicken, mashed potatoes, green beans, water or noodles, turkey burger with red sauce, salad with dressing, garlic bread, water  Snack:  mango sorbet or banana Beverages:  water, water mixed with minute maid, frappe occasional  NUTRITION  INTERVENTION  Nutrition education (E-1) on the following topics:   Initial Follow-up  [x]  []  Definition of Gestational Diabetes [x]  []  Why dietary management is important in controlling blood glucose Poorly controlled diabetes can lead to fetal macrosomia, shoulder dystocia and neurological injuries, stillbirth and neonatal complications including respiratory distress syndrome, hypoglycemia and prolonged NICU admission.  [x]  []  Effects each nutrient has on blood glucose levels [x]  []  Simple carbohydrates vs complex carbohydrates [x]  []  Fluid intake [x]  []  Creating a balanced meal plan [x]  []  Carbohydrate counting  [x]  []  When to check blood glucose levels [x]  []  Proper blood glucose monitoring techniques [x]  []  Effect of stress and stress reduction techniques  [x]  []  Exercise effect on blood glucose levels, appropriate exercise during pregnancy [x]  []  Importance of limiting caffeine and abstaining from alcohol and smoking [x]  []  Medications used for blood sugar control during pregnancy [x]  []  Hypoglycemia and rule of 15 [x]  []  Postpartum self care  Patient has a meter prescription prior to visit and was encouraged to pick up from pharmacy and contact the office with any issues. Patient is instructed to begin testing pre breakfast and 2 hours after each meal. CBG: 93 mg/dL, reported as pre meal per Pt   Patient instructed to monitor glucose levels: QID FBS: 60 - <= 95 mg/dL; 2 hour: <= 879 mg/dL  Patient received handouts: Nutrition Diabetes and Pregnancy Carbohydrate Counting List Blood glucose log Snack ideas for  diabetes during pregnancy Plate Planner Accu Chek voucher for free glucometer  Patient will be seen for follow-up as needed.     [1]  Current Outpatient Medications:    Prenatal Vit-Fe Fumarate-FA (PRENATAL MULTIVITAMIN) TABS tablet, Take 2 tablets by mouth daily at 12 noon., Disp: , Rfl:    Accu-Chek Softclix Lancets lancets, Check glucose four times a day, Disp:  100 each, Rfl: 12   Blood Glucose Monitoring Suppl (ACCU-CHEK GUIDE) w/Device KIT, 1 kit by Does not apply route in the morning, at noon, in the evening, and at bedtime., Disp: 1 kit, Rfl: 0   ferrous sulfate  325 (65 FE) MG EC tablet, Take 1 tablet (325 mg total) by mouth every other day., Disp: 30 tablet, Rfl: 2   glucose blood (ACCU-CHEK GUIDE TEST) test strip, Use as instructed, Disp: 100 each, Rfl: 12  "

## 2025-01-16 ENCOUNTER — Encounter: Attending: Family Medicine | Admitting: Dietician

## 2025-01-16 ENCOUNTER — Other Ambulatory Visit: Payer: Self-pay

## 2025-01-16 ENCOUNTER — Ambulatory Visit: Payer: Self-pay | Admitting: Dietician

## 2025-01-16 ENCOUNTER — Ambulatory Visit: Admitting: Family Medicine

## 2025-01-16 ENCOUNTER — Encounter: Payer: Self-pay | Admitting: Family Medicine

## 2025-01-16 VITALS — BP 104/69 | HR 85 | Wt 157.2 lb

## 2025-01-16 DIAGNOSIS — Z3A39 39 weeks gestation of pregnancy: Secondary | ICD-10-CM | POA: Insufficient documentation

## 2025-01-16 DIAGNOSIS — O24410 Gestational diabetes mellitus in pregnancy, diet controlled: Secondary | ICD-10-CM | POA: Diagnosis present

## 2025-01-16 DIAGNOSIS — Z3A29 29 weeks gestation of pregnancy: Secondary | ICD-10-CM

## 2025-01-16 DIAGNOSIS — Z713 Dietary counseling and surveillance: Secondary | ICD-10-CM | POA: Insufficient documentation

## 2025-01-16 DIAGNOSIS — Z3400 Encounter for supervision of normal first pregnancy, unspecified trimester: Secondary | ICD-10-CM

## 2025-01-16 DIAGNOSIS — O99013 Anemia complicating pregnancy, third trimester: Secondary | ICD-10-CM

## 2025-01-16 NOTE — Patient Instructions (Signed)

## 2025-01-16 NOTE — Progress Notes (Signed)
" ° °  Subjective:  Mercedes Guzman is a 24 y.o. G1P0000 at [redacted]w[redacted]d being seen today for ongoing prenatal care.  She is currently monitored for the following issues for this high-risk pregnancy and has Encounter for supervision of low-risk first pregnancy, antepartum; Anemia complicating pregnancy, third trimester; and Gestational diabetes mellitus, diet-controlled on their problem list.  Patient reports no complaints.  Contractions: Not present. Vag. Bleeding: None.  Movement: Present. Denies leaking of fluid.   The following portions of the patient's history were reviewed and updated as appropriate: allergies, current medications, past family history, past medical history, past social history, past surgical history and problem list. Problem list updated.  Objective:   Vitals:   01/16/25 1531  BP: 104/69  Pulse: 85  Weight: 157 lb 3 oz (71.3 kg)    Fetal Status: Fetal Heart Rate (bpm): 155 Fundal Height: 29 cm Movement: Present     General:  Alert, oriented and cooperative. Patient is in no acute distress.  Skin: Skin is warm and dry. No rash noted.   Cardiovascular: Normal heart rate noted  Respiratory: Normal respiratory effort, no problems with respiration noted  Abdomen: Soft, gravid, appropriate for gestational age. Pain/Pressure: Absent     Pelvic: Vag. Bleeding: None     Cervical exam deferred        Extremities: Normal range of motion.  Edema: None  Mental Status: Normal mood and affect. Normal behavior. Normal judgment and thought content.   Urinalysis:      Assessment and Plan:  Pregnancy: G1P0000 at [redacted]w[redacted]d  1. Encounter for supervision of low-risk first pregnancy, antepartum (Primary) BP and FHR normal FH 29 cm, appropriate  2. Diet controlled gestational diabetes mellitus (GDM) in third trimester Diagnosed after last visit, had diabetic teaching just earlier today so no log to review Next growth US  scheduled for 01/20/2025  3. Anemia complicating pregnancy, third  trimester Lab Results  Component Value Date   HGB 10.0 (L) 01/02/2025   Mild, PO iron  Preterm labor symptoms and general obstetric precautions including but not limited to vaginal bleeding, contractions, leaking of fluid and fetal movement were reviewed in detail with the patient. Please refer to After Visit Summary for other counseling recommendations.  Return in 2 weeks (on 01/30/2025) for Dyad patient, ob visit.   Lola Donnice HERO, MD "

## 2025-01-20 ENCOUNTER — Ambulatory Visit

## 2025-01-20 ENCOUNTER — Ambulatory Visit: Attending: Maternal & Fetal Medicine

## 2025-01-29 ENCOUNTER — Ambulatory Visit: Admitting: Family Medicine

## 2025-01-29 ENCOUNTER — Other Ambulatory Visit: Payer: Self-pay

## 2025-01-29 VITALS — BP 111/75 | HR 82 | Wt 161.0 lb

## 2025-01-29 DIAGNOSIS — O99013 Anemia complicating pregnancy, third trimester: Secondary | ICD-10-CM

## 2025-01-29 DIAGNOSIS — O24410 Gestational diabetes mellitus in pregnancy, diet controlled: Secondary | ICD-10-CM

## 2025-01-29 DIAGNOSIS — O26842 Uterine size-date discrepancy, second trimester: Secondary | ICD-10-CM

## 2025-01-29 DIAGNOSIS — Z3A31 31 weeks gestation of pregnancy: Secondary | ICD-10-CM

## 2025-01-29 DIAGNOSIS — Z3400 Encounter for supervision of normal first pregnancy, unspecified trimester: Secondary | ICD-10-CM

## 2025-01-29 DIAGNOSIS — O26843 Uterine size-date discrepancy, third trimester: Secondary | ICD-10-CM

## 2025-01-29 NOTE — Progress Notes (Signed)
 "  PRENATAL VISIT NOTE  Subjective:  Mercedes Guzman is a 24 y.o. G1P0000 at [redacted]w[redacted]d being seen today for ongoing prenatal care.  She is currently monitored for the following issues for this high-risk pregnancy and has Encounter for supervision of low-risk first pregnancy, antepartum; Anemia complicating pregnancy, third trimester; and Gestational diabetes mellitus, diet-controlled on their problem list.  Patient reports no complaints.  Contractions: Not present. Vag. Bleeding: None.  Movement: Present. Denies leaking of fluid.   The following portions of the patient's history were reviewed and updated as appropriate: allergies, current medications, past family history, past medical history, past social history, past surgical history and problem list.   Objective:   Vitals:   01/29/25 1341  BP: 111/75  Pulse: 82  Weight: 161 lb (73 kg)    Fetal Status:  Fetal Heart Rate (bpm): 159   Movement: Present    General: Alert, oriented and cooperative. Patient is in no acute distress.  Skin: Skin is warm and dry. No rash noted.   Cardiovascular: Normal heart rate noted  Respiratory: Normal respiratory effort, no problems with respiration noted  Abdomen: Soft, gravid, appropriate for gestational age.  Pain/Pressure: Absent     Pelvic: Cervical exam deferred        Extremities: Normal range of motion.  Edema: None  Mental Status: Normal mood and affect. Normal behavior. Normal judgment and thought content.      11/11/2024    9:33 AM  Depression screen PHQ 2/9  Decreased Interest 0  Down, Depressed, Hopeless 0  PHQ - 2 Score 0  Altered sleeping 0  Tired, decreased energy 1  Change in appetite 0  Feeling bad or failure about yourself  0  Trouble concentrating 0  Moving slowly or fidgety/restless 0  Suicidal thoughts 0  PHQ-9 Score 1  Difficult doing work/chores Not difficult at all        11/11/2024    9:33 AM  GAD 7 : Generalized Anxiety Score  Nervous, Anxious, on Edge 0    Control/stop worrying 0   Worry too much - different things 0   Trouble relaxing 0   Restless 0   Easily annoyed or irritable 0   Afraid - awful might happen 0   Total GAD 7 Score 0  Anxiety Difficulty Not difficult at all     Data saved with a previous flowsheet row definition    Assessment and Plan:  Pregnancy: G1P0000 at [redacted]w[redacted]d 1. Encounter for supervision of low-risk first pregnancy, antepartum (Primary) FHR BP appropriate today  2. Diet controlled gestational diabetes mellitus (GDM) in third trimester 18/44 blood glucoses out of range. Lengthy discussion with patient about blood glucoses and she reports she snacks between when she eats her meals and when she takes her blood sugars.  Also her fasting blood sugars are not true fasting because she will sometimes wake up in the middle of the night and have a snack.  3. Anemia complicating pregnancy, third trimester Previously prescribed iron.  Has not been taking it because she thought it may affect her blood sugars.  Recommend she take every other day iron.  She will start.  4. Uterine size-date discrepancy in second trimester Fundal height of 31 cm today  5. [redacted] weeks gestation of pregnancy   Preterm labor symptoms and general obstetric precautions including but not limited to vaginal bleeding, contractions, leaking of fluid and fetal movement were reviewed in detail with the patient. Please refer to After Visit Summary for other counseling  recommendations.   No follow-ups on file.  Future Appointments  Date Time Provider Department Center  02/06/2025  7:15 AM [facility] PROVIDER 1 [facility]  02/06/2025  7:30 AM [facility] US1 [facility]    Norleen LULLA Rover, MD  "

## 2025-02-06 ENCOUNTER — Ambulatory Visit

## 2025-02-12 ENCOUNTER — Encounter: Admitting: Family Medicine
